# Patient Record
Sex: Female | Born: 1970
Health system: Southern US, Community
[De-identification: ages and names within clinical notes are randomized; demographics above are authoritative.]

## PROBLEM LIST (undated history)

## (undated) ENCOUNTER — Ambulatory Visit (HOSPITAL_COMMUNITY)
Admission: EM | Discharge status: Absent without leave | Payer: Federal, State, Local not specified - PPO | Source: Ambulatory Visit

## (undated) DIAGNOSIS — G43909 Migraine, unspecified, not intractable, without status migrainosus: Secondary | ICD-10-CM

## (undated) DIAGNOSIS — J329 Chronic sinusitis, unspecified: Secondary | ICD-10-CM

## (undated) DIAGNOSIS — I10 Essential (primary) hypertension: Secondary | ICD-10-CM

## (undated) DIAGNOSIS — K219 Gastro-esophageal reflux disease without esophagitis: Secondary | ICD-10-CM

## (undated) DIAGNOSIS — R7303 Prediabetes: Secondary | ICD-10-CM

## (undated) DIAGNOSIS — Z9889 Other specified postprocedural states: Secondary | ICD-10-CM

## (undated) DIAGNOSIS — J45909 Unspecified asthma, uncomplicated: Secondary | ICD-10-CM

## (undated) HISTORY — DX: Chronic sinusitis, unspecified: J32.9

## (undated) HISTORY — DX: Migraine, unspecified, not intractable, without status migrainosus: G43.909

## (undated) HISTORY — DX: Gastro-esophageal reflux disease without esophagitis: K21.9

## (undated) HISTORY — DX: Unspecified asthma, uncomplicated: J45.909

## (undated) HISTORY — PX: CHOLECYSTECTOMY: SHX55

---

## 1996-10-07 HISTORY — PX: CHOLECYSTECTOMY: SHX55

## 1999-06-21 ENCOUNTER — Other Ambulatory Visit: Admission: RE | Admit: 1999-06-21 | Discharge: 1999-06-21 | Payer: Self-pay | Admitting: Obstetrics & Gynecology

## 1999-12-11 ENCOUNTER — Inpatient Hospital Stay (HOSPITAL_COMMUNITY): Admission: AD | Admit: 1999-12-11 | Discharge: 1999-12-13 | Payer: Self-pay | Admitting: Obstetrics and Gynecology

## 2000-01-08 ENCOUNTER — Other Ambulatory Visit: Admission: RE | Admit: 2000-01-08 | Discharge: 2000-01-08 | Payer: Self-pay | Admitting: Obstetrics & Gynecology

## 2001-10-28 ENCOUNTER — Other Ambulatory Visit: Admission: RE | Admit: 2001-10-28 | Discharge: 2001-10-28 | Payer: Self-pay | Admitting: Obstetrics and Gynecology

## 2002-04-04 ENCOUNTER — Inpatient Hospital Stay (HOSPITAL_COMMUNITY): Admission: AD | Admit: 2002-04-04 | Discharge: 2002-04-07 | Payer: Self-pay | Admitting: Obstetrics and Gynecology

## 2002-05-12 ENCOUNTER — Other Ambulatory Visit: Admission: RE | Admit: 2002-05-12 | Discharge: 2002-05-12 | Payer: Self-pay | Admitting: Obstetrics & Gynecology

## 2003-11-10 ENCOUNTER — Ambulatory Visit (HOSPITAL_COMMUNITY): Admission: RE | Admit: 2003-11-10 | Discharge: 2003-11-10 | Payer: Self-pay | Admitting: Obstetrics and Gynecology

## 2003-12-05 ENCOUNTER — Other Ambulatory Visit: Admission: RE | Admit: 2003-12-05 | Discharge: 2003-12-05 | Payer: Self-pay | Admitting: Obstetrics and Gynecology

## 2004-08-11 ENCOUNTER — Inpatient Hospital Stay (HOSPITAL_COMMUNITY): Admission: AD | Admit: 2004-08-11 | Discharge: 2004-08-13 | Payer: Self-pay | Admitting: Obstetrics and Gynecology

## 2004-08-14 ENCOUNTER — Encounter: Admission: RE | Admit: 2004-08-14 | Discharge: 2004-09-13 | Payer: Self-pay | Admitting: Obstetrics and Gynecology

## 2004-10-14 ENCOUNTER — Encounter: Admission: RE | Admit: 2004-10-14 | Discharge: 2004-11-13 | Payer: Self-pay | Admitting: Obstetrics and Gynecology

## 2004-11-14 ENCOUNTER — Encounter: Admission: RE | Admit: 2004-11-14 | Discharge: 2004-12-14 | Payer: Self-pay | Admitting: Obstetrics and Gynecology

## 2005-05-16 IMAGING — US US PELVIS COMPLETE MODIFY
1 series · 14 of 23 positions shown · non-contrast
Comparison: none

CLINICAL DATA: Positive pregnancy test, bleeding.

[Series 1: unknown · 0.30mm/px · 14 of 23 slices shown]
[im 1/23]
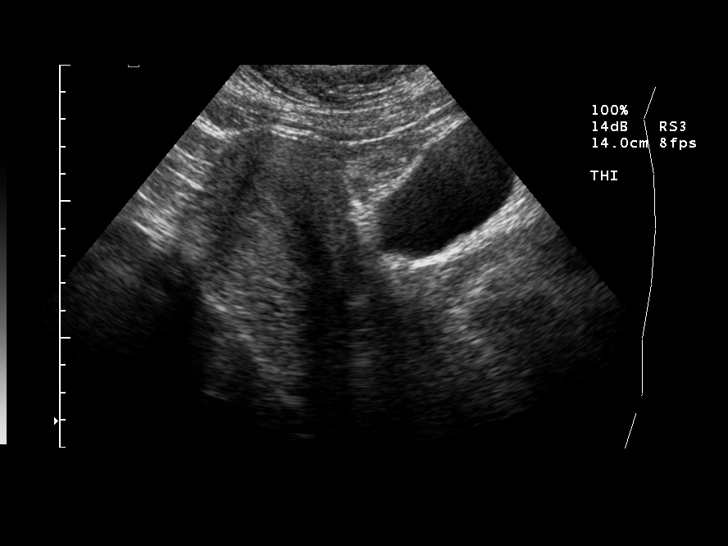
[im 3/23]
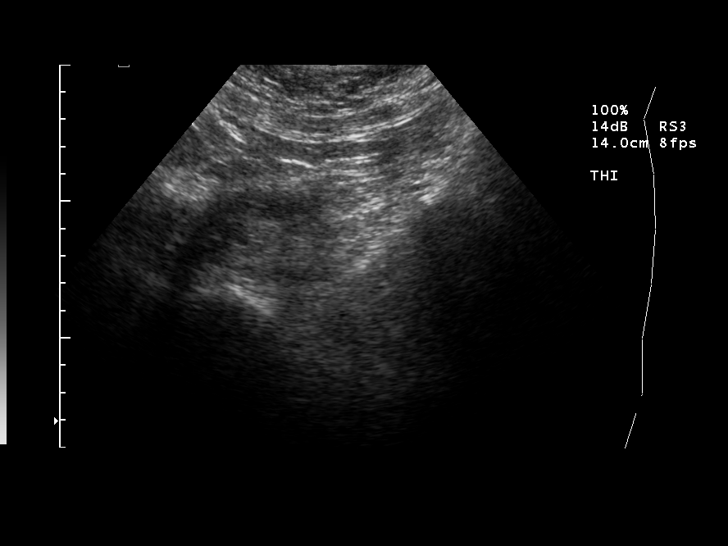
[im 5/23]
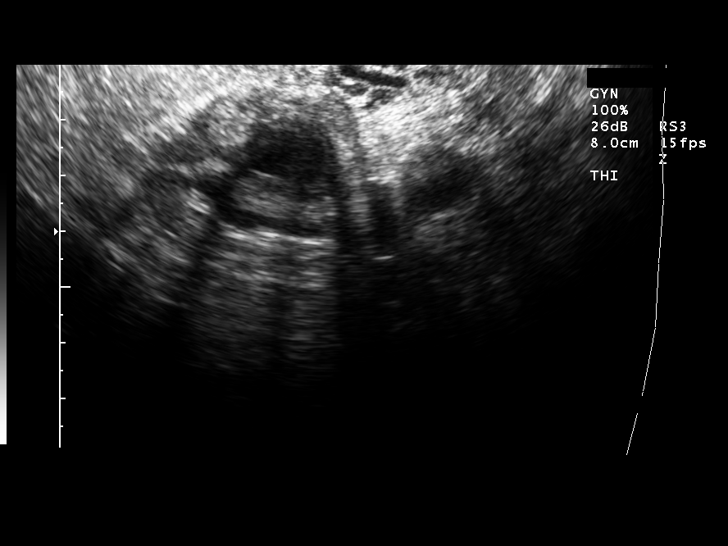
[im 6/23]
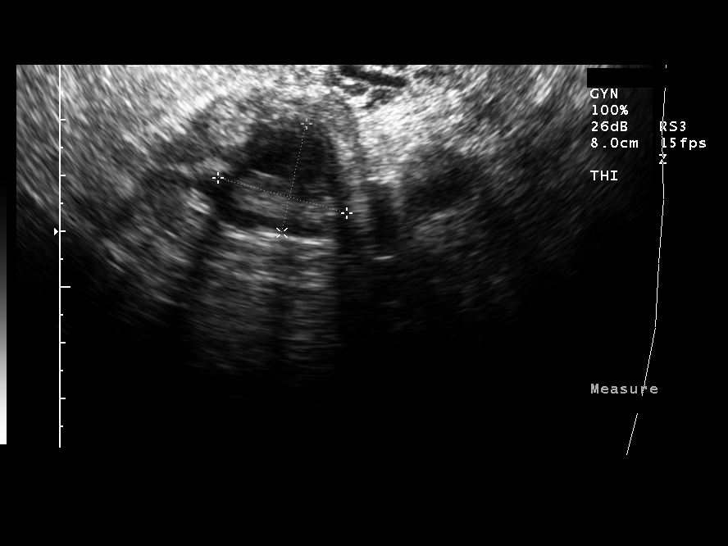
[im 8/23]
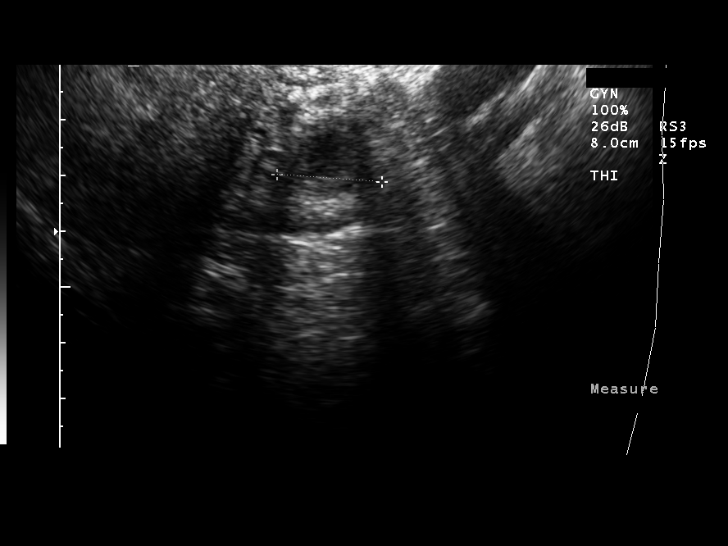
[im 10/23]
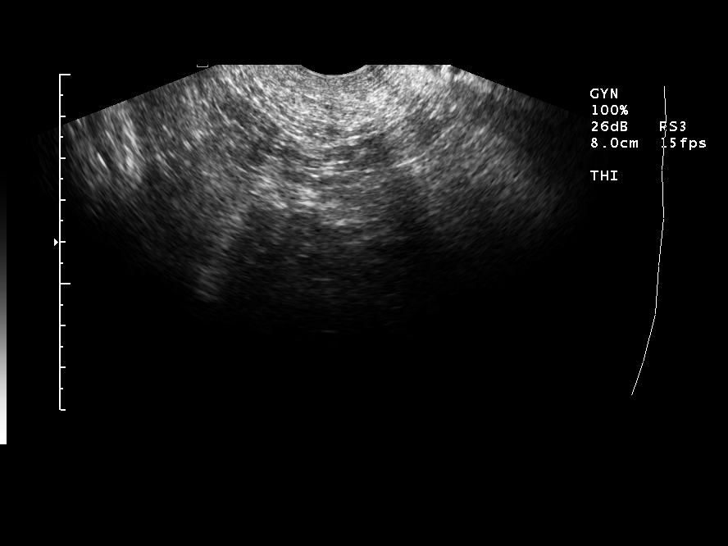
[im 11/23]
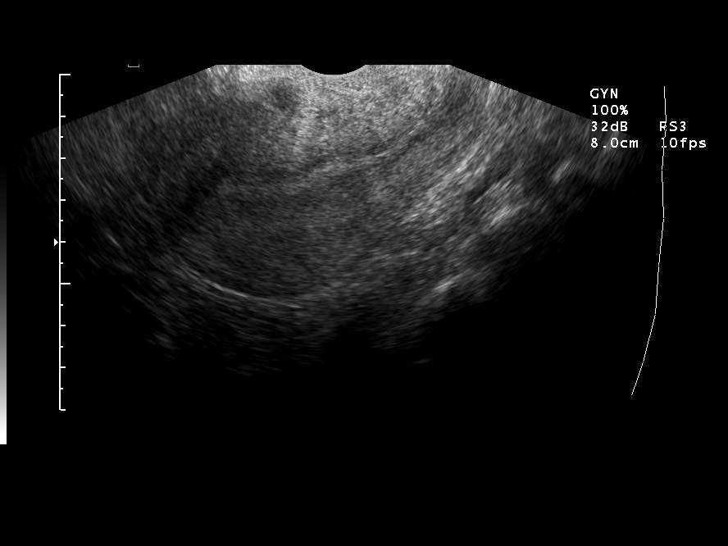
[im 13/23]
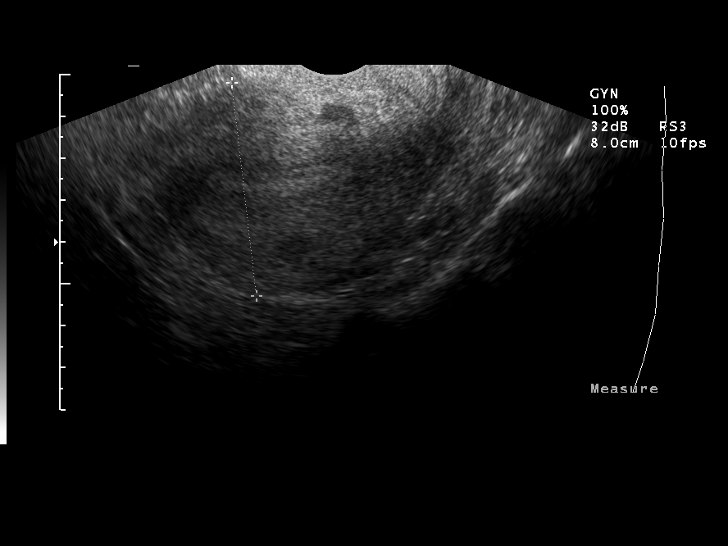
[im 14/23]
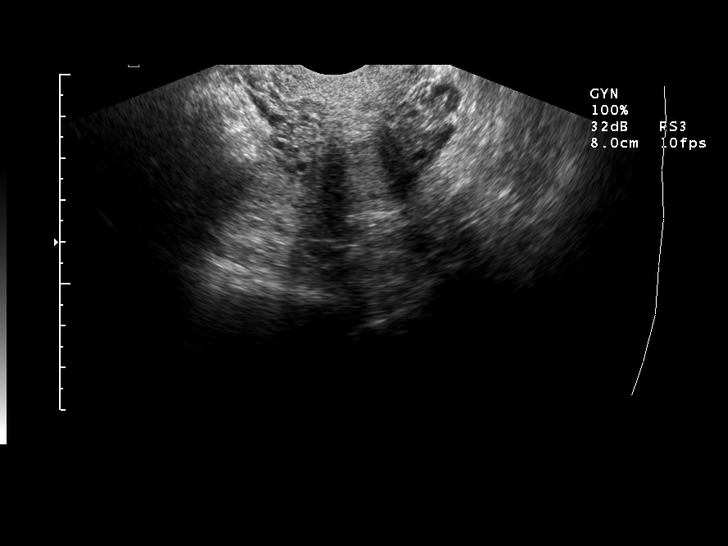
[im 16/23]
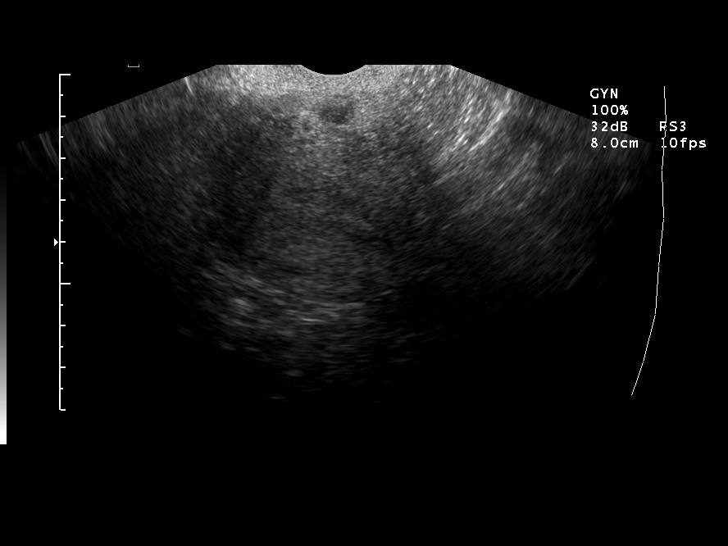
[im 18/23]
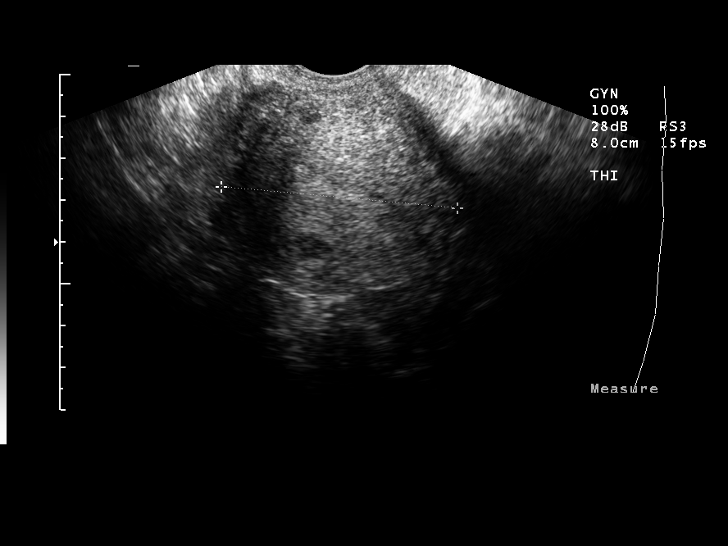
[im 19/23]
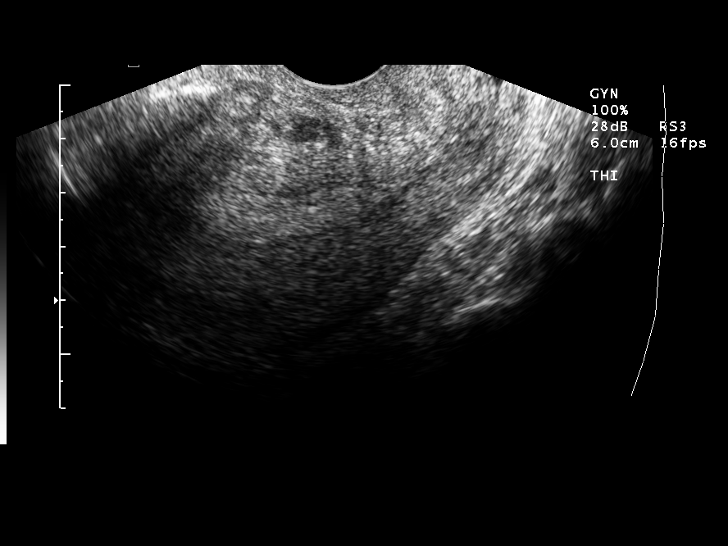
[im 21/23]
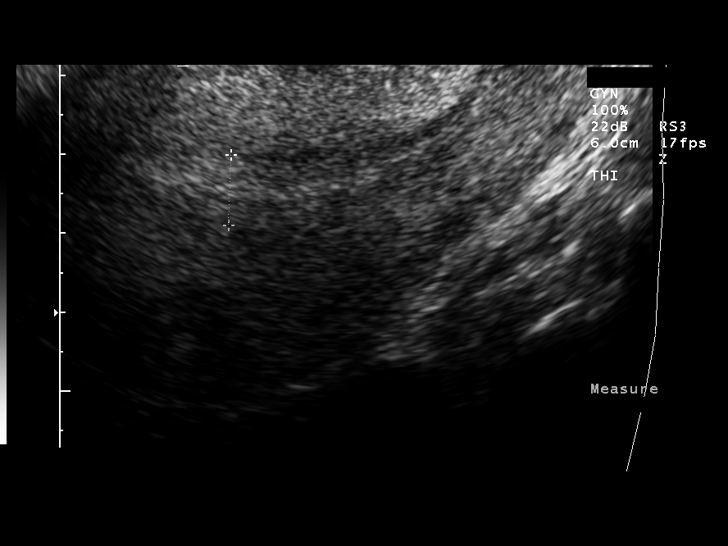
[im 23/23]
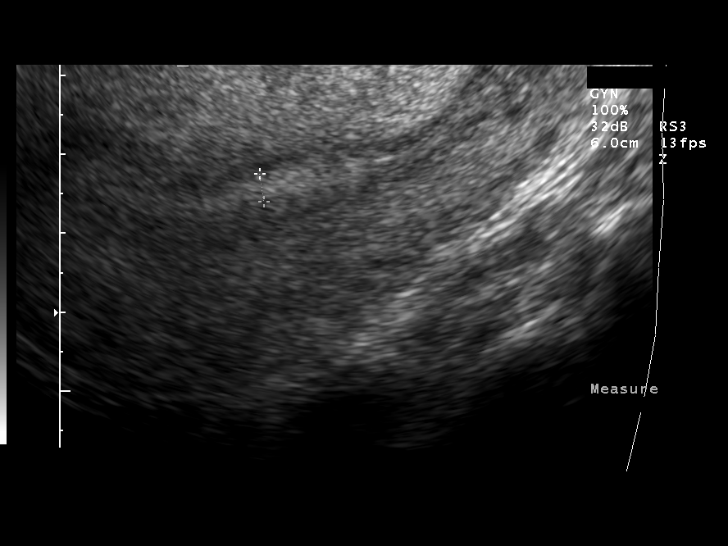

[14 of 23 positions shown; findings below may reference images not displayed]

TRANSABDOMINAL AND TRANSVAGINAL PELVIC ULTRASOUND
 Scanning was performed transabdominally through a minimally full urinary bladder, then transvaginally.  Uterine cervical complex appears normal in size, shape, and position with the uterus measuring 6.2 x 3.0 x 5.1 cm.  The endometrium measures 5 mm.  There is no intrauterine gestational sac.  There is no fluid in the endometrial canal.  The myometrial tissue appears normal.  The right ovary is not identified.  The left ovary measures 2.4 x 2.0 x 1.9 cm.  There is no free fluid.  No sign of an adnexal mass. 

 IMPRESSION
 No sign of intrauterine gestational sac or fluid in the endometrial canal. 

 No free fluid. 

 No adnexal lesion seen.  The right ovary is not identified on either form of the scan. 

 [REDACTED]

## 2010-10-09 ENCOUNTER — Encounter
Admission: RE | Admit: 2010-10-09 | Discharge: 2010-11-06 | Payer: Self-pay | Source: Home / Self Care | Attending: Family Medicine | Admitting: Family Medicine

## 2010-11-12 ENCOUNTER — Encounter: Payer: Federal, State, Local not specified - PPO | Attending: Family Medicine | Admitting: *Deleted

## 2010-11-12 DIAGNOSIS — Z713 Dietary counseling and surveillance: Secondary | ICD-10-CM | POA: Insufficient documentation

## 2010-11-12 DIAGNOSIS — E669 Obesity, unspecified: Secondary | ICD-10-CM | POA: Insufficient documentation

## 2010-12-10 ENCOUNTER — Encounter: Payer: Federal, State, Local not specified - PPO | Attending: Family Medicine | Admitting: *Deleted

## 2010-12-10 DIAGNOSIS — E669 Obesity, unspecified: Secondary | ICD-10-CM | POA: Insufficient documentation

## 2010-12-10 DIAGNOSIS — Z713 Dietary counseling and surveillance: Secondary | ICD-10-CM | POA: Insufficient documentation

## 2011-01-07 ENCOUNTER — Encounter: Payer: Federal, State, Local not specified - PPO | Admitting: *Deleted

## 2011-01-22 ENCOUNTER — Encounter: Payer: Federal, State, Local not specified - PPO | Attending: Family Medicine | Admitting: *Deleted

## 2011-01-22 DIAGNOSIS — Z713 Dietary counseling and surveillance: Secondary | ICD-10-CM | POA: Insufficient documentation

## 2011-01-22 DIAGNOSIS — E669 Obesity, unspecified: Secondary | ICD-10-CM | POA: Insufficient documentation

## 2011-02-22 NOTE — H&P (Signed)
NAME:  Marie Davis, Marie Davis                 ACCOUNT NO.:  [id number]   MEDICAL RECORD NO.:  [id number]          PATIENT TYPE:  MAT   LOCATION:  [facility]                          FACILITY:  [facility]   PHYSICIAN:  Lenoard Aden, M.D.DATE OF BIRTH:  1971-05-03   DATE OF ADMISSION:  DATE OF DISCHARGE:                                HISTORY & PHYSICAL   CHIEF COMPLAINT:  History of precipitous labor for induction.   HISTORY OF PRESENT ILLNESS:  The patient is a 40 year old white female G5,  P3, EDB August 16, 2004 at 39+ weeks for induction due to favorable cervix  and a history of precipitous labor.  She has allergies to Phenergan and  Reglan.   MEDICATIONS:  Prenatal vitamins.   History of three spontaneous vaginal deliveries, cholecystectomy in 1998.   FAMILY HISTORY:  Diabetes, thyroid dysfunction.   The patient with a history of migraine headaches.  Blood type is A+. GBS is  negative.  Rubella immune.  Hepatitis and HIV negative.   PHYSICAL EXAMINATION:  GENERAL:  A well-developed, well-nourished, white  female in no acute distress.  HEENT:  Normal.  LUNGS:  Clear.  HEART:  A regular rhythm.  ABDOMEN:  Soft, gravid, nontender.  PELVIC:  Cervix 2-3 cm, 50% vertex, -2.  EXTREMITIES:  Reveal no cords.  NEUROLOGICAL:  Exam is nonfocal.   IMPRESSION:  1.  A 39 week intra-uterine pregnancy.  2.  History of precipitous labor for augmentation and induction.   PLAN:  To proceed with Pitocin augmentation and induction.  The risks and  benefits were discussed.  The patient acknowledges and wishes to proceed.     RJT/MEDQ  D:  08/10/2004  T:  08/11/2004  Job:  [id number]   cc:   [facility]

## 2011-02-22 NOTE — Discharge Summary (Signed)
[facility]  Patient:    Marie Davis, Marie Davis                          MRN: [id number] Adm. Date:  [id number] Disc. Date: [id number] Attending:  Lenoard Aden CC:         Sung Amabile. Roslyn Smiling, M.D.                           Discharge Summary  DISCHARGE DIAGNOSES:          1. Intrauterine pregnancy at term,                                  delivered.                               2. Rhesus-positive.  OPERATIVE PROCEDURES:         Spontaneous vaginal delivery and Pitocin augmentation on December 11, 1999.  HISTORY OF PRESENT ILLNESS:   The patient is a 40 year old woman, G2, P70, [facility] December 25, 1999, admitted at [redacted] weeks gestational age with spontaneous rupture of membranes at 5 a.m. on December 11, 1999.  HOSPITAL COURSE:              The patient was admitted to [facility] labor and delivery.  Cervix at the time of the admission was 3 to 4 cm. Contractions were q.2-3 min.  Pitocin augmentation was necessary to improve labor pattern later in the morning.  Epidural anesthesia was given at the patients request during the active phase of labor.  Labor progression was excellent with the second state at 17 minutes.  She was delivered spontaneously of a live female, 3070 grams with Apgars of 9 and 9 over an intact perineum.  Estimated blood loss was 350 cc and there were no complications.  POSTPARTUM COURSE:            The patients postpartum course was unremarkable.  Her hemoglobin stabilized at 11.2.  She was discharged to home on the second postpartum in stable condition.  She will be followed up by the [facility] in four to six weeks.  Routine status post vaginal delivery instructions were given.  MEDICATIONS AT DISCHARGE:     Prenatal vitamins and Motrin 600 mg q.6h. p.r.n. D:  02/01/00 TD:  02/06/00 Job: [id number]

## 2011-04-12 ENCOUNTER — Encounter: Payer: Self-pay | Admitting: Family Medicine

## 2011-04-12 DIAGNOSIS — G43909 Migraine, unspecified, not intractable, without status migrainosus: Secondary | ICD-10-CM | POA: Insufficient documentation

## 2011-04-12 DIAGNOSIS — J329 Chronic sinusitis, unspecified: Secondary | ICD-10-CM | POA: Insufficient documentation

## 2013-04-26 ENCOUNTER — Ambulatory Visit (INDEPENDENT_AMBULATORY_CARE_PROVIDER_SITE_OTHER): Payer: Federal, State, Local not specified - PPO | Admitting: Family Medicine

## 2013-04-26 VITALS — BP 130/88 | HR 70 | Resp 16 | Wt 293.0 lb

## 2013-04-26 DIAGNOSIS — J069 Acute upper respiratory infection, unspecified: Secondary | ICD-10-CM

## 2013-04-26 DIAGNOSIS — J329 Chronic sinusitis, unspecified: Secondary | ICD-10-CM

## 2013-04-26 DIAGNOSIS — J019 Acute sinusitis, unspecified: Secondary | ICD-10-CM

## 2013-04-28 ENCOUNTER — Encounter: Payer: Self-pay | Admitting: Family Medicine

## 2013-04-28 DIAGNOSIS — J019 Acute sinusitis, unspecified: Secondary | ICD-10-CM | POA: Insufficient documentation

## 2013-04-28 DIAGNOSIS — J069 Acute upper respiratory infection, unspecified: Secondary | ICD-10-CM | POA: Insufficient documentation

## 2013-04-28 NOTE — Progress Notes (Signed)
  Subjective:    Patient ID: Marie Davis, female    DOB: [birth year], 42 y.o.   MRN: [id number]  HPI Patient presents with sinus pressure and drainage for the past 5 days. She has a history recurrent sinusitis typically gets once a year severely. She does note that she had a dry cough that started 2 days ago. She's been taking Mucinex and NyQuil with little relief. She'll be traveling next week and is concerned about worsening symptoms or if any fever or shortness of breath nausea vomiting.   Review of Systems - per above  GEN- denies fatigue, fever, weight loss,weakness, recent illness HEENT- denies eye drainage, change in vision, +nasal discharge, CVS- denies chest pain, palpitations RESP- denies SOB, cough, wheeze       Objective:   Physical Exam  GEN- NAD, alert and oriented x3 HEENT- PERRL, EOMI, non injected sclera, pink conjunctiva, MMM, oropharynx clear, TM clear bilat no effusion, + maxillary sinus tenderness,+ inflammed turbinates,  Nasal drainage  Neck- Supple, no LAD CVS- RRR, no murmur RESP-CTAB Pulses- Radial 2+         Assessment & Plan:

## 2013-04-28 NOTE — Assessment & Plan Note (Signed)
Supportive care. This is likely viral. She was given Marie Davis for the cough increase fluids per

## 2013-04-28 NOTE — Assessment & Plan Note (Signed)
Based on the time and this is likely viral and she has some cough associated. She does have history of recurrent sinusitis and since she is leaving town I will go ahead and give her prescription for Augmentin one tablet twice a day for 10 days. She will also be given Flonase for sinusitis and postnasal drip.

## 2013-10-06 ENCOUNTER — Telehealth: Payer: Self-pay | Admitting: *Deleted

## 2013-10-06 MED ORDER — FLUTICASONE PROPIONATE 50 MCG/ACT NA SUSP: 2.0000 | Freq: Every day | NASAL | Status: DC

## 2013-10-06 NOTE — Telephone Encounter (Signed)
Fluticasone prop spray, use 2 sprays each nostril daily as needed  Med refilled

## 2013-10-08 ENCOUNTER — Telehealth: Payer: Self-pay | Admitting: Family Medicine

## 2013-10-08 MED ORDER — FLUTICASONE PROPIONATE 50 MCG/ACT NA SUSP: 2.0000 | Freq: Every day | NASAL | Status: DC

## 2013-10-08 NOTE — Telephone Encounter (Signed)
Medication refilled per protocol. 

## 2016-10-03 DIAGNOSIS — Z1151 Encounter for screening for human papillomavirus (HPV): Secondary | ICD-10-CM | POA: Diagnosis not present

## 2016-10-03 DIAGNOSIS — N920 Excessive and frequent menstruation with regular cycle: Secondary | ICD-10-CM | POA: Diagnosis not present

## 2016-10-03 DIAGNOSIS — Z1231 Encounter for screening mammogram for malignant neoplasm of breast: Secondary | ICD-10-CM | POA: Diagnosis not present

## 2016-10-03 DIAGNOSIS — Z01419 Encounter for gynecological examination (general) (routine) without abnormal findings: Secondary | ICD-10-CM | POA: Diagnosis not present

## 2016-10-03 DIAGNOSIS — R87615 Unsatisfactory cytologic smear of cervix: Secondary | ICD-10-CM | POA: Diagnosis not present

## 2016-10-28 DIAGNOSIS — R87615 Unsatisfactory cytologic smear of cervix: Secondary | ICD-10-CM | POA: Diagnosis not present

## 2016-10-28 DIAGNOSIS — N83201 Unspecified ovarian cyst, right side: Secondary | ICD-10-CM | POA: Diagnosis not present

## 2016-10-28 DIAGNOSIS — N920 Excessive and frequent menstruation with regular cycle: Secondary | ICD-10-CM | POA: Diagnosis not present

## 2017-03-09 DIAGNOSIS — J019 Acute sinusitis, unspecified: Secondary | ICD-10-CM | POA: Diagnosis not present

## 2017-03-09 DIAGNOSIS — R030 Elevated blood-pressure reading, without diagnosis of hypertension: Secondary | ICD-10-CM | POA: Diagnosis not present

## 2017-03-10 ENCOUNTER — Ambulatory Visit (INDEPENDENT_AMBULATORY_CARE_PROVIDER_SITE_OTHER): Payer: Federal, State, Local not specified - PPO | Admitting: Physician Assistant

## 2017-03-10 ENCOUNTER — Encounter (INDEPENDENT_AMBULATORY_CARE_PROVIDER_SITE_OTHER): Payer: Self-pay | Admitting: Physician Assistant

## 2017-03-10 ENCOUNTER — Ambulatory Visit (INDEPENDENT_AMBULATORY_CARE_PROVIDER_SITE_OTHER): Payer: Self-pay

## 2017-03-10 VITALS — Ht 62.0 in | Wt 295.0 lb

## 2017-03-10 DIAGNOSIS — M79672 Pain in left foot: Secondary | ICD-10-CM | POA: Diagnosis not present

## 2017-03-10 NOTE — Addendum Note (Signed)
Addended by: Richardean CanalLARK, Jalisha Enneking on: 03/10/2017 04:08 PM   Modules accepted: Level of Service

## 2017-03-10 NOTE — Progress Notes (Signed)
Office Visit Note   Patient: Marie CornerKaren M Ditullio           Date of Birth: 02/20/1971           MRN: [id number] Visit Date: 03/10/2017              Requested by: Donita BrooksPickard, Warren T, MD [address] [place], [place] [id number] PCP: Donita BrooksPickard, Warren T, MD   Assessment & Plan: Visit Diagnoses:  1. Pain in left foot     Plan: Explained to her that she needed to be good about her shoe wear. See if there is a particular shoe that is rubbing the lateral aspect of her foot that may have caused her pain. Otherwise she should be measured for shoes and is to make sure she sizes not changed. She follow with us on an as-needed basis  Follow-Up Instructions: Return if symptoms worsen or fail to improve.   Orders:  Orders Placed This Encounter  Procedures  . XR Foot Complete Left   No orders of the defined types were placed in this encounter.     Procedures: No procedures performed   Clinical Data: No additional findings.   Subjective: Chief Complaint  Patient presents with  . Left Foot - Pain    HPI This is a 46 year old female were seen for the first time due to left foot pain for about a week and a half. She did notice some extreme pain in the foot after doing some walking . She had some swelling but no bruising. No known injury. Pain overall trending towards improvement. She does feel that after rest for approximately 4 days with upper respiratory infection that her pain has began to subside. She is really having no pain at this point in time with ambulation. She mostly wanted x-rays today to make sure she did not have some type of stress fracture.  Review of Systems  Positive for Recent upper respiratory infection. Otherwise negative please see history of present illness  Objective: Vital Signs: Ht 5\' 2"  (1.575 m)   Wt 295 lb (133.8 kg)   BMI 53.96 kg/m   Physical Exam  Constitutional: She is oriented to person, place, and time. She appears well-developed and  well-nourished. No distress.  Pulmonary/Chest: Effort normal.  Neurological: She is alert and oriented to person, place, and time.  Psychiatric: She has a normal mood and affect. Her behavior is normal.    Ortho Exam Left foot: Sensation intact throughout the foot to light touch. Dorsal pedal pulses present. No rashes skin lesions ulcerations, ecchymosis or edema compared to the right. 5 out of 5 strength with inversion eversion against resistance left foot. Tenderness over the lateral and dorsal aspect of the fifth metatarsal head only. Good range of motion through the fifth metatarsal phalangeal joint without pain. Remainder of the left foot is nontender. Specialty Comments:  No specialty comments available.  Imaging: Xr Foot Complete Left  Result Date: 03/10/2017 Left foot 3 views: No acute fracture. Bipartite medial sesamoid. Otherwise no bony abnormalities. No subluxation of the fifth MP joint.    PMFS History: Patient Active Problem List   Diagnosis Date Noted  . Acute sinusitis 04/28/2013  . URI, acute 04/28/2013  . Migraines   . Recurrent sinus infections    Past Medical History:  Diagnosis Date  . Acid reflux   . Asthma   . Migraines   . Recurrent sinus infections     Family History  Problem Relation Age  of Onset  . Lung disease Mother   . Heart disease Maternal Grandmother   . Diabetes Maternal Grandfather     Past Surgical History:  Procedure Laterality Date  . CHOLECYSTECTOMY     Social History   Occupational History  . Not on file.   Social History Main Topics  . Smoking status: Never Smoker  . Smokeless tobacco: Never Used  . Alcohol use No  . Drug use: No  . Sexual activity: Not on file

## 2017-03-14 DIAGNOSIS — N920 Excessive and frequent menstruation with regular cycle: Secondary | ICD-10-CM | POA: Diagnosis not present

## 2017-03-17 ENCOUNTER — Ambulatory Visit (INDEPENDENT_AMBULATORY_CARE_PROVIDER_SITE_OTHER): Payer: Federal, State, Local not specified - PPO | Admitting: Physician Assistant

## 2017-03-20 DIAGNOSIS — Z3202 Encounter for pregnancy test, result negative: Secondary | ICD-10-CM | POA: Diagnosis not present

## 2017-03-20 DIAGNOSIS — N920 Excessive and frequent menstruation with regular cycle: Secondary | ICD-10-CM | POA: Diagnosis not present

## 2017-03-20 DIAGNOSIS — R030 Elevated blood-pressure reading, without diagnosis of hypertension: Secondary | ICD-10-CM | POA: Diagnosis not present

## 2017-04-18 DIAGNOSIS — N920 Excessive and frequent menstruation with regular cycle: Secondary | ICD-10-CM | POA: Diagnosis not present

## 2018-08-06 ENCOUNTER — Encounter (HOSPITAL_COMMUNITY): Payer: Self-pay

## 2018-08-06 ENCOUNTER — Ambulatory Visit (HOSPITAL_COMMUNITY)
Admission: EM | Admit: 2018-08-06 | Discharge: 2018-08-06 | Disposition: A | Discharge status: Home / Self Care | Payer: Federal, State, Local not specified - PPO | Attending: Emergency Medicine | Admitting: Emergency Medicine

## 2018-08-06 ENCOUNTER — Other Ambulatory Visit: Payer: Self-pay

## 2018-08-06 DIAGNOSIS — J22 Unspecified acute lower respiratory infection: Secondary | ICD-10-CM

## 2018-08-06 DIAGNOSIS — J4521 Mild intermittent asthma with (acute) exacerbation: Secondary | ICD-10-CM

## 2018-08-06 MED ORDER — PREDNISONE 20 MG PO TABS: 40.0000 mg | ORAL_TABLET | Freq: Every day | ORAL | 0 refills | Status: AC

## 2018-08-06 MED ORDER — ALBUTEROL SULFATE 108 (90 BASE) MCG/ACT IN AEPB: 1.0000 | INHALATION_SPRAY | RESPIRATORY_TRACT | 0 refills | Status: AC | PRN

## 2018-08-06 MED ORDER — AZITHROMYCIN 250 MG PO TABS: ORAL_TABLET | ORAL | 0 refills | Status: AC

## 2018-08-06 NOTE — ED Provider Notes (Signed)
[facility]    CSN: [id number] Arrival date & time: 08/06/18  4098     History   Chief Complaint Chief Complaint  Patient presents with  . Cough    HPI Marie Davis is a 47 y.o. female.   Marie Davis presents with complaints of worsening cough. Developed URI symptoms a week ago with sore throat and congestion. These symptoms improved somewhat but led to increased cough. Cough has been worsening. Has episodes of unable to catch breath and wheezing. Chest tightness. Cough is productive. No known fevers. No gi/gu complaints. No further sore throat or ear pain. No rash. History of asthma, states her inhaler has ran out. Has been taking sudafed, mucinex, dayquil/nyquil which have not helped. 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 Doesn't smoke. Hx of asthma, reflux, migraines. Doesn't follow with a PCP.     ROS per HPI.      Past Medical History:  Diagnosis Date  . Acid reflux   . Asthma   . Migraines   . Recurrent sinus infections     Patient Active Problem List   Diagnosis Date Noted  . Acute sinusitis 04/28/2013  . URI, acute 04/28/2013  . Migraines   . Recurrent sinus infections     Past Surgical History:  Procedure Laterality Date  . CHOLECYSTECTOMY      OB History   None      Home Medications    Prior to Admission medications   Medication Sig Start Date End Date Taking? Authorizing Provider  Albuterol Sulfate 108 (90 Base) MCG/ACT AEPB Inhale 1-2 puffs into the lungs every 4 (four) hours as needed. 08/06/18   Georgetta Haber, NP  azithromycin (ZITHROMAX) 250 MG tablet Take 2 tablets (500 mg total) by mouth daily for 1 day, THEN 1 tablet (250 mg total) daily for 4 days. 08/06/18 08/11/18  Georgetta Haber, NP  predniSONE (DELTASONE) 20 MG tablet Take 2 tablets (40 mg total) by mouth daily with breakfast for 3 days. 08/06/18 08/09/18  Georgetta Haber, NP    Family History Family History  Problem Relation Age of Onset  . Lung disease Mother   .  Heart disease Maternal Grandmother   . Diabetes Maternal Grandfather     Social History Social History   Tobacco Use  . Smoking status: Never Smoker  . Smokeless tobacco: Never Used  Substance Use Topics  . Alcohol use: No  . Drug use: No     Allergies   Neosporin [neomycin-polymyxin-gramicidin]   Review of Systems Review of Systems   Physical Exam Triage Vital Signs ED Triage Vitals  Enc Vitals Group     BP 08/06/18 0834 (!) 173/112     Pulse Rate 08/06/18 0834 85     Resp 08/06/18 0834 16     Temp 08/06/18 0834 (!) 97.3 F (36.3 C)     Temp Source 08/06/18 0834 Oral     SpO2 08/06/18 0834 94 %     Weight 08/06/18 0837 289 lb (131.1 kg)     Height --      Head Circumference --      Peak Flow --      Pain Score --      Pain Loc --      Pain Edu? --      Excl. in GC? --    No data found.  Updated Vital Signs BP (!) 173/112 (BP Location: Left Wrist)   Pulse 85   Temp (!) 97.3 F (  36.3 C) (Oral)   Resp 16   Wt 289 lb (131.1 kg)   SpO2 94%   BMI 52.86 kg/m   Physical Exam  Constitutional: She is oriented to person, place, and time. She appears well-developed and well-nourished. No distress.  HENT:  Head: Normocephalic and atraumatic.  Right Ear: Tympanic membrane, external ear and ear canal normal.  Left Ear: Tympanic membrane, external ear and ear canal normal.  Nose: Nose normal.  Mouth/Throat: Uvula is midline, oropharynx is clear and moist and mucous membranes are normal. No tonsillar exudate.  Eyes: Pupils are equal, round, and reactive to light. Conjunctivae and EOM are normal.  Cardiovascular: Normal rate, regular rhythm and normal heart sounds.  Pulmonary/Chest: Effort normal. No accessory muscle usage. No tachypnea. No respiratory distress. She has decreased breath sounds in the right lower field and the left lower field.  Strong congested cough noted; illicited with deep breathing   Neurological: She is alert and oriented to person, place,  and time.  Skin: Skin is warm and dry.     UC Treatments / Results  Labs (all labs ordered are listed, but only abnormal results are displayed) Labs Reviewed - No data to display  EKG None  Radiology No results found.  Procedures Procedures (including critical care time)  Medications Ordered in UC Medications - No data to display  Initial Impression / Assessment and Plan / UC Course  I have reviewed the triage vital signs and the nursing notes.  Pertinent labs & imaging results that were available during my care of the patient were reviewed by me and considered in my medical decision making (see chart for details).     Worsening cough after 7 days of illness. Discussed viral vs bacterial etiology. Worsening cough in this asthmatic patient, opted to provided empiric coverage with azithromycin. Prednisone and inhaler also provided. Return precautions provided. If symptoms worsen or do not improve in the next week to return to be seen or to follow up with PCP.  Patient verbalized understanding and agreeable to plan.  Encouraged establish with PCP for BP recheck.  Final Clinical Impressions(s) / UC Diagnoses   Final diagnoses:  Lower respiratory tract infection  Mild intermittent asthma with exacerbation     Discharge Instructions     Push fluids to ensure adequate hydration and keep secretions thin.  ,Tylenol and/or ibuprofen as needed for pain or fevers.  3 days of prednisone.  Use of inhaler as needed.  Complete course of antibiotics.  If symptoms worsen or do not improve in the next week to return to be seen.  Please establish with a primary care provider for recheck as needed and for recheck of your Bp.      ED Prescriptions    Medication Sig Dispense Auth. Provider   Albuterol Sulfate 108 (90 Base) MCG/ACT AEPB Inhale 1-2 puffs into the lungs every 4 (four) hours as needed. 1 each Georgetta Haber, NP   azithromycin (ZITHROMAX) 250 MG tablet Take 2 tablets (500  mg total) by mouth daily for 1 day, THEN 1 tablet (250 mg total) daily for 4 days. 6 tablet Linus Mako B, NP   predniSONE (DELTASONE) 20 MG tablet Take 2 tablets (40 mg total) by mouth daily with breakfast for 3 days. 6 tablet Georgetta Haber, NP     Controlled Substance Prescriptions [place] Controlled Substance Registry consulted? Not Applicable   Georgetta Haber, NP 08/06/18 [phone]

## 2018-08-06 NOTE — ED Triage Notes (Signed)
CC:  cough congestion asthma x 1 week ago. Pt thinks it a cold that's moved down in her chest. ( chest congestion )

## 2018-08-06 NOTE — Discharge Instructions (Signed)
Push fluids to ensure adequate hydration and keep secretions thin.  ,Tylenol and/or ibuprofen as needed for pain or fevers.  3 days of prednisone.  Use of inhaler as needed.  Complete course of antibiotics.  If symptoms worsen or do not improve in the next week to return to be seen.  Please establish with a primary care provider for recheck as needed and for recheck of your Bp.

## 2018-10-07 HISTORY — PX: GASTRIC BYPASS: SHX52

## 2018-10-19 DIAGNOSIS — J320 Chronic maxillary sinusitis: Secondary | ICD-10-CM | POA: Diagnosis not present

## 2018-11-25 DIAGNOSIS — I10 Essential (primary) hypertension: Secondary | ICD-10-CM | POA: Diagnosis not present

## 2018-12-11 DIAGNOSIS — Z713 Dietary counseling and surveillance: Secondary | ICD-10-CM | POA: Diagnosis not present

## 2018-12-11 DIAGNOSIS — Z6841 Body Mass Index (BMI) 40.0 and over, adult: Secondary | ICD-10-CM | POA: Diagnosis not present

## 2018-12-18 DIAGNOSIS — Z0000 Encounter for general adult medical examination without abnormal findings: Secondary | ICD-10-CM | POA: Diagnosis not present

## 2018-12-18 DIAGNOSIS — I10 Essential (primary) hypertension: Secondary | ICD-10-CM | POA: Diagnosis not present

## 2018-12-18 DIAGNOSIS — Z13220 Encounter for screening for lipoid disorders: Secondary | ICD-10-CM | POA: Diagnosis not present

## 2018-12-18 DIAGNOSIS — Z6841 Body Mass Index (BMI) 40.0 and over, adult: Secondary | ICD-10-CM | POA: Diagnosis not present

## 2018-12-18 DIAGNOSIS — Z23 Encounter for immunization: Secondary | ICD-10-CM | POA: Diagnosis not present

## 2019-01-27 DIAGNOSIS — E785 Hyperlipidemia, unspecified: Secondary | ICD-10-CM | POA: Diagnosis not present

## 2019-01-27 DIAGNOSIS — Z6841 Body Mass Index (BMI) 40.0 and over, adult: Secondary | ICD-10-CM | POA: Diagnosis not present

## 2019-01-27 DIAGNOSIS — I10 Essential (primary) hypertension: Secondary | ICD-10-CM | POA: Diagnosis not present

## 2019-01-29 DIAGNOSIS — I10 Essential (primary) hypertension: Secondary | ICD-10-CM | POA: Diagnosis not present

## 2019-03-05 DIAGNOSIS — Z713 Dietary counseling and surveillance: Secondary | ICD-10-CM | POA: Diagnosis not present

## 2019-03-05 DIAGNOSIS — Z6841 Body Mass Index (BMI) 40.0 and over, adult: Secondary | ICD-10-CM | POA: Diagnosis not present

## 2019-03-17 DIAGNOSIS — R5383 Other fatigue: Secondary | ICD-10-CM | POA: Diagnosis not present

## 2019-03-17 DIAGNOSIS — Z136 Encounter for screening for cardiovascular disorders: Secondary | ICD-10-CM | POA: Diagnosis not present

## 2019-03-17 DIAGNOSIS — Z131 Encounter for screening for diabetes mellitus: Secondary | ICD-10-CM | POA: Diagnosis not present

## 2019-03-17 DIAGNOSIS — E559 Vitamin D deficiency, unspecified: Secondary | ICD-10-CM | POA: Diagnosis not present

## 2019-03-17 DIAGNOSIS — E785 Hyperlipidemia, unspecified: Secondary | ICD-10-CM | POA: Diagnosis not present

## 2019-03-17 DIAGNOSIS — I10 Essential (primary) hypertension: Secondary | ICD-10-CM | POA: Diagnosis not present

## 2019-03-17 DIAGNOSIS — Z6841 Body Mass Index (BMI) 40.0 and over, adult: Secondary | ICD-10-CM | POA: Diagnosis not present

## 2019-03-19 DIAGNOSIS — Z6841 Body Mass Index (BMI) 40.0 and over, adult: Secondary | ICD-10-CM | POA: Diagnosis not present

## 2019-03-19 DIAGNOSIS — I10 Essential (primary) hypertension: Secondary | ICD-10-CM | POA: Diagnosis not present

## 2019-03-19 DIAGNOSIS — R7303 Prediabetes: Secondary | ICD-10-CM | POA: Diagnosis not present

## 2019-03-21 DIAGNOSIS — Z6841 Body Mass Index (BMI) 40.0 and over, adult: Secondary | ICD-10-CM | POA: Diagnosis not present

## 2019-03-21 DIAGNOSIS — R7303 Prediabetes: Secondary | ICD-10-CM | POA: Diagnosis not present

## 2019-03-21 DIAGNOSIS — Z1159 Encounter for screening for other viral diseases: Secondary | ICD-10-CM | POA: Diagnosis not present

## 2019-03-21 DIAGNOSIS — Z01812 Encounter for preprocedural laboratory examination: Secondary | ICD-10-CM | POA: Diagnosis not present

## 2019-03-21 DIAGNOSIS — I10 Essential (primary) hypertension: Secondary | ICD-10-CM | POA: Diagnosis not present

## 2019-03-24 DIAGNOSIS — Z79899 Other long term (current) drug therapy: Secondary | ICD-10-CM | POA: Diagnosis not present

## 2019-03-24 DIAGNOSIS — K2950 Unspecified chronic gastritis without bleeding: Secondary | ICD-10-CM | POA: Diagnosis not present

## 2019-03-24 DIAGNOSIS — Z6841 Body Mass Index (BMI) 40.0 and over, adult: Secondary | ICD-10-CM | POA: Diagnosis not present

## 2019-03-24 DIAGNOSIS — K219 Gastro-esophageal reflux disease without esophagitis: Secondary | ICD-10-CM | POA: Diagnosis not present

## 2019-03-24 DIAGNOSIS — R7303 Prediabetes: Secondary | ICD-10-CM | POA: Diagnosis not present

## 2019-03-24 DIAGNOSIS — J45909 Unspecified asthma, uncomplicated: Secondary | ICD-10-CM | POA: Diagnosis not present

## 2019-03-24 DIAGNOSIS — I10 Essential (primary) hypertension: Secondary | ICD-10-CM | POA: Diagnosis not present

## 2019-03-24 DIAGNOSIS — Z881 Allergy status to other antibiotic agents status: Secondary | ICD-10-CM | POA: Diagnosis not present

## 2019-03-31 DIAGNOSIS — Z993 Dependence on wheelchair: Secondary | ICD-10-CM | POA: Diagnosis not present

## 2019-03-31 DIAGNOSIS — Z1159 Encounter for screening for other viral diseases: Secondary | ICD-10-CM | POA: Diagnosis not present

## 2019-04-13 DIAGNOSIS — I10 Essential (primary) hypertension: Secondary | ICD-10-CM | POA: Diagnosis not present

## 2019-04-13 DIAGNOSIS — E785 Hyperlipidemia, unspecified: Secondary | ICD-10-CM | POA: Diagnosis not present

## 2019-04-13 DIAGNOSIS — Z6841 Body Mass Index (BMI) 40.0 and over, adult: Secondary | ICD-10-CM | POA: Diagnosis not present

## 2019-04-13 DIAGNOSIS — Z01818 Encounter for other preprocedural examination: Secondary | ICD-10-CM | POA: Diagnosis not present

## 2019-04-15 DIAGNOSIS — Z1159 Encounter for screening for other viral diseases: Secondary | ICD-10-CM | POA: Diagnosis not present

## 2019-04-15 DIAGNOSIS — Z01812 Encounter for preprocedural laboratory examination: Secondary | ICD-10-CM | POA: Diagnosis not present

## 2019-04-19 DIAGNOSIS — I10 Essential (primary) hypertension: Secondary | ICD-10-CM | POA: Diagnosis not present

## 2019-04-19 DIAGNOSIS — Z6841 Body Mass Index (BMI) 40.0 and over, adult: Secondary | ICD-10-CM | POA: Diagnosis not present

## 2019-04-19 DIAGNOSIS — E785 Hyperlipidemia, unspecified: Secondary | ICD-10-CM | POA: Diagnosis not present

## 2019-04-19 DIAGNOSIS — Z79899 Other long term (current) drug therapy: Secondary | ICD-10-CM | POA: Diagnosis not present

## 2019-04-19 DIAGNOSIS — K219 Gastro-esophageal reflux disease without esophagitis: Secondary | ICD-10-CM | POA: Diagnosis not present

## 2019-04-28 DIAGNOSIS — B029 Zoster without complications: Secondary | ICD-10-CM | POA: Diagnosis not present

## 2019-05-10 DIAGNOSIS — Z6841 Body Mass Index (BMI) 40.0 and over, adult: Secondary | ICD-10-CM | POA: Diagnosis not present

## 2019-05-10 DIAGNOSIS — Z713 Dietary counseling and surveillance: Secondary | ICD-10-CM | POA: Diagnosis not present

## 2019-06-02 ENCOUNTER — Encounter: Payer: Self-pay | Admitting: Orthopaedic Surgery

## 2019-06-02 ENCOUNTER — Ambulatory Visit: Payer: Self-pay

## 2019-06-02 ENCOUNTER — Ambulatory Visit (INDEPENDENT_AMBULATORY_CARE_PROVIDER_SITE_OTHER): Payer: Federal, State, Local not specified - PPO | Admitting: Orthopaedic Surgery

## 2019-06-02 DIAGNOSIS — M1711 Unilateral primary osteoarthritis, right knee: Secondary | ICD-10-CM | POA: Diagnosis not present

## 2019-06-02 NOTE — Progress Notes (Signed)
Office Visit Note   Patient: Marie Davis           Date of Birth: 03/02/1971           MRN: [id number] Visit Date: 06/02/2019              Requested by: Donita BrooksPickard, Warren T, MD [address] [place],  [place] [id number] PCP: Donita BrooksPickard, Warren T, MD   Assessment & Plan: Visit Diagnoses:  1. Primary osteoarthritis of right knee     Plan: Due to the fact the patient had a recent gastric sleeve she is been told to avoid any steroids.  She is also unable to take oral anti-inflammatories due to this.  Therefore recommend that she check with her surgeon and see if she is able to use topical anti-inflammatories if so she can pick up diclofenac gel and applied ice to the knee 4 g 4 times daily.  She also is going to ask if she is able to take turmeric.  Discussed with her knee friendly exercises and quad strengthening.  She will follow-up with us on as-needed basis pain persist or becomes worse.  Questions were encouraged and answered at length today  Follow-Up Instructions: Return if symptoms worsen or fail to improve.   Orders:  Orders Placed This Encounter  Procedures  . XR Knee 1-2 Views Right   No orders of the defined types were placed in this encounter.     Procedures: No procedures performed   Clinical Data: No additional findings.   Subjective: Chief Complaint  Patient presents with  . Right Knee - Pain    HPI [name] is a 48 year old female who not seen since 2018.  She comes in today with complaint of right knee pain.  Pains been on and off for the past month.  She is exercising and doing a lot of side to side motion is doing aerobics.  She states the inside is on motion causes pain medial aspect right knee.  She had no particular injury to the knee.  She describes no mechanical symptoms in the knee.  She is recently undergone a gastric sleeve surgery and is still recovering from that she is lost 65 pounds since the surgery.  Review of Systems Negative for  fevers chills shortness breath chest pain  Objective: Vital Signs: There were no vitals taken for this visit.  Physical Exam Constitutional:      Appearance: She is not ill-appearing or diaphoretic.  Pulmonary:     Effort: Pulmonary effort is normal.  Neurological:     Mental Status: She is alert and oriented to person, place, and time.  Psychiatric:        Mood and Affect: Mood normal.     Ortho Exam Bilateral knees she has full range of motion without pain.  No instability valgus varus stressing of either knee.  Significant patellofemoral crepitus bilateral knees with passive range of motion.  Tenderness along medial joint line of the right knee only.  No effusion abnormal warmth of either knee.  McMurray's is negative bilaterally.  Calf supple nontender bilaterally Specialty Comments:  No specialty comments available.  Imaging: Xr Knee 1-2 Views Right  Result Date: 06/02/2019 Right knee AP lateral views: No acute fractures.  Moderate medial joint line narrowing.  Osteophytes off the lateral aspect of the lateral joint line.  Patellofemoral joint appears well maintained.  No bony abnormalities otherwise.    PMFS History: Patient Active Problem List  Diagnosis Date Noted  . Primary osteoarthritis of right knee 06/02/2019  . Acute sinusitis 04/28/2013  . URI, acute 04/28/2013  . Migraines   . Recurrent sinus infections    Past Medical History:  Diagnosis Date  . Acid reflux   . Asthma   . Migraines   . Recurrent sinus infections     Family History  Problem Relation Age of Onset  . Lung disease Mother   . Heart disease Maternal Grandmother   . Diabetes Maternal Grandfather     Past Surgical History:  Procedure Laterality Date  . CHOLECYSTECTOMY     Social History   Occupational History  . Not on file  Tobacco Use  . Smoking status: Never Smoker  . Smokeless tobacco: Never Used  Substance and Sexual Activity  . Alcohol use: No  . Drug use: No  . Sexual  activity: Not on file

## 2019-07-21 DIAGNOSIS — Z713 Dietary counseling and surveillance: Secondary | ICD-10-CM | POA: Diagnosis not present

## 2019-10-07 DIAGNOSIS — Z903 Acquired absence of stomach [part of]: Secondary | ICD-10-CM | POA: Diagnosis not present

## 2019-10-07 DIAGNOSIS — K912 Postsurgical malabsorption, not elsewhere classified: Secondary | ICD-10-CM | POA: Diagnosis not present

## 2023-04-03 ENCOUNTER — Encounter: Payer: Self-pay | Admitting: Physician Assistant

## 2023-04-03 ENCOUNTER — Ambulatory Visit: Payer: BC Managed Care – PPO | Admitting: Physician Assistant

## 2023-04-03 ENCOUNTER — Other Ambulatory Visit (INDEPENDENT_AMBULATORY_CARE_PROVIDER_SITE_OTHER): Payer: BC Managed Care – PPO

## 2023-04-03 VITALS — Ht 62.0 in | Wt 257.0 lb

## 2023-04-03 DIAGNOSIS — M25552 Pain in left hip: Secondary | ICD-10-CM

## 2023-04-03 MED ORDER — CELECOXIB 200 MG PO CAPS: 200.0000 mg | ORAL_CAPSULE | Freq: Every day | ORAL | 1 refills | Status: DC

## 2023-04-03 NOTE — Progress Notes (Signed)
Office Visit Note   Patient: Marie Davis           Date of Birth: 02-01-1971           MRN: [id number] Visit Date: 04/03/2023              Requested by: No referring provider defined for this encounter. PCP: Pcp, No   Assessment & Plan: Visit Diagnoses:  1. Pain of left hip     Plan: Plan will have her undergo an intra-articular injection of her left hip in the near future.  This both for diagnostic and therapeutic purposes.  She will continue her ibuprofen.  Will see her back after she returns from [place] to see what type of response she had to the injection.  Questions were encouraged and answered at length.  Follow-Up Instructions: Return for 2 weeks after hip injection.   Orders:  Orders Placed This Encounter  Procedures   XR HIP UNILAT W OR W/O PELVIS 1V LEFT   Meds ordered this encounter  Medications   celecoxib (CELEBREX) 200 MG capsule    Sig: Take 1 capsule (200 mg total) by mouth daily.    Dispense:  30 capsule    Refill:  1      Procedures: No procedures performed   Clinical Data: No additional findings.   Subjective: Chief Complaint  Patient presents with   Left Hip - Pain    HPI Marie Davis is a 52 year old female well-known to Dr. Raye Sorrow service comes in today with left hip pain for months.  Pain into the thigh goes down to the knee.  She notes groin pain.  Pain is worse with standing or walking for long periods of time.  She states ibuprofen helps minimally dulls the pain.  She reports that pains been in her hip since dancing at a wedding on May 20.  She is due to go to [place] on July 9.  She is is looking for some relief so that she can get through the trip in [place]. Review of Systems See HPI otherwise negative  Objective: Vital Signs: Ht 5\' 2"  (1.575 m)   Wt 257 lb (116.6 kg)   BMI 47.01 kg/m   Physical Exam General well-developed well-nourished female no acute distress mood and affect appropriate.  Psych alert and oriented x 3 Ortho  Exam Bilateral hips: Good range of motion of both hips.  She has pain with internal rotation of the left hip.  No tenderness over the trochanteric region bilaterally.  Ambulates without any assistive device. Specialty Comments:  No specialty comments available.  Imaging: XR HIP UNILAT W OR W/O PELVIS 1V LEFT  Result Date: 04/03/2023 AP pelvis lateral view left hip: Bilateral hips well located.  Left hip with slight narrowing particularly on the lateral view.  No other bony abnormalities.  No acute fractures.    PMFS History: Patient Active Problem List   Diagnosis Date Noted   Primary osteoarthritis of right knee 06/02/2019   Acute sinusitis 04/28/2013   URI, acute 04/28/2013   Migraines    Recurrent sinus infections    Past Medical History:  Diagnosis Date   Acid reflux    Asthma    Migraines    Recurrent sinus infections     Family History  Problem Relation Age of Onset   Lung disease Mother    Heart disease Maternal Grandmother    Diabetes Maternal Grandfather     Past Surgical History:  Procedure Laterality Date  CHOLECYSTECTOMY     Social History   Occupational History   Not on file  Tobacco Use   Smoking status: Never   Smokeless tobacco: Never  Substance and Sexual Activity   Alcohol use: No   Drug use: No   Sexual activity: Not on file

## 2023-04-04 ENCOUNTER — Other Ambulatory Visit: Payer: Self-pay

## 2023-04-04 ENCOUNTER — Ambulatory Visit (INDEPENDENT_AMBULATORY_CARE_PROVIDER_SITE_OTHER): Payer: BC Managed Care – PPO | Admitting: Sports Medicine

## 2023-04-04 DIAGNOSIS — M25552 Pain in left hip: Secondary | ICD-10-CM | POA: Diagnosis not present

## 2023-04-04 MED ORDER — LIDOCAINE HCL 1 % IJ SOLN
4.0000 mL | INTRAMUSCULAR | Status: AC | PRN
  Administered 2023-04-04: 4 mL

## 2023-04-04 MED ORDER — METHYLPREDNISOLONE ACETATE 80 MG/ML IJ SUSP
80.0000 mg | INTRAMUSCULAR | Status: AC | PRN
  Administered 2023-04-04: 80 mg via INTRA_ARTICULAR

## 2023-04-04 NOTE — Progress Notes (Signed)
   Procedure Note  Patient: Marie Davis             Date of Birth: 1971-03-04           MRN: [id number]             Visit Date: 04/04/2023  Procedures: Visit Diagnoses:  1. Pain of left hip    Large Joint Inj: L hip joint on 04/04/2023 10:46 AM Indications: pain Details: 22 G 3.5 in needle, ultrasound-guided anterior approach Medications: 4 mL lidocaine 1 %; 80 mg methylPREDNISolone acetate 80 MG/ML Outcome: tolerated well, no immediate complications  Procedure: US-guided intra-articular hip injection, left After discussion on risks/benefits/indications and informed verbal consent was obtained, a timeout was performed. Patient was lying supine on exam table. The hip was cleaned with betadine and alcohol swabs. Then utilizing ultrasound guidance, the patient's femoral head and neck junction was identified and subsequently injected with 4:1  lidocaine:depomedrol via an in-plane approach with ultrasound visualization of the injectate administered into the hip joint. Patient tolerated procedure well without immediate complications.  Procedure, treatment alternatives, risks and benefits explained, specific risks discussed. Consent was given by the patient. Immediately prior to procedure a time out was called to verify the correct patient, procedure, equipment, support staff and site/side marked as required. Patient was prepped and draped in the usual sterile fashion.    - I evaluated the patient about 5 minutes post-injection and she had improvement in pain and range of motion - follow-up with Rexene Edison as indicated; I am happy to see them as needed  Madelyn Brunner, DO Primary Care Sports Medicine Physician  [facility] - Orthopedics  This note was dictated using Dragon naturally speaking software and may contain errors in syntax, spelling, or content which have not been identified prior to signing this note.

## 2023-05-25 ENCOUNTER — Other Ambulatory Visit: Payer: Self-pay | Admitting: Physician Assistant

## 2023-06-02 ENCOUNTER — Ambulatory Visit (INDEPENDENT_AMBULATORY_CARE_PROVIDER_SITE_OTHER): Payer: BC Managed Care – PPO | Admitting: Physician Assistant

## 2023-06-02 ENCOUNTER — Ambulatory Visit: Payer: BC Managed Care – PPO | Admitting: Physician Assistant

## 2023-06-02 ENCOUNTER — Other Ambulatory Visit: Payer: Self-pay

## 2023-06-02 DIAGNOSIS — M25552 Pain in left hip: Secondary | ICD-10-CM

## 2023-06-02 MED ORDER — CELECOXIB 200 MG PO CAPS: 200.0000 mg | ORAL_CAPSULE | Freq: Every day | ORAL | 1 refills | Status: DC

## 2023-06-02 MED ORDER — DIAZEPAM 5 MG PO TABS: ORAL_TABLET | ORAL | 0 refills | Status: DC

## 2023-06-02 NOTE — Progress Notes (Signed)
HPI: [name] returns today for follow-up of her left hip.  She states she is not having groin pain but has anterior thigh pain.  She states whenever she moves her knee bed she has pain in the anterior thigh.  She feels the leg is weak.  She has difficulty donning shoes and socks due to the pain.  She also states that she has trouble walking due to the pain.  She does work as a Runner, broadcasting/film/video.  She feels that pain may be coming from her knee.  She has been taking Celebrex and Tylenol for pain and also has tried Voltaren gel. Notes that the injection by Dr. Shon Davis 04/04/2023 left hip intra-articular gave her relief from the hip pain particularly the groin pain and allowed her to get through her trip to [place].  Review of systems see HPI otherwise negative  Physical exam: General Well-developed well-nourished female no acute distress mood and affect appropriate.  Bilateral hips: Good range of motion both hips.  Left hip with internal/external rotation flexion and logrolling causes pain in the groin area and anterior thigh. Bilateral knees good range of motion without pain.  Impression: Left hip pain   Plan given the fact that she has continued pain in her left hip with range of motion and diminished range of motion with performing ADLs recommend MRI.  MRIs to evaluate for internal derangement left hip to evaluate cartilage as her radiographs showed only some slight narrowing.  I have her follow-up with Dr. Raye Davis after the MRI to go over results discuss further treatment.  She is claustrophobic and therefore Valium was sent in.  Also per her request did send in Celebrex.  She states she has had no adverse reactions to the Celebrex.  She does have a visit with her primary care physician next week and should get labs at that time.

## 2023-06-10 ENCOUNTER — Telehealth: Payer: Self-pay | Admitting: Physician Assistant

## 2023-06-10 NOTE — Telephone Encounter (Signed)
Called pt 1X and left vm for pt to call to set MRI review appt with PA Chestine Spore or Dr Magnus Ivan about 10 days after MRI on 9/25. Imagining is back up sending back results.

## 2023-06-11 ENCOUNTER — Telehealth: Payer: Self-pay | Admitting: Physician Assistant

## 2023-06-11 NOTE — Telephone Encounter (Signed)
Called pt 2X and left vm for pt to call and set MRI review with Magnus Ivan or [place] 10 days after 9/25

## 2023-07-02 ENCOUNTER — Ambulatory Visit
Admission: RE | Admit: 2023-07-02 | Discharge: 2023-07-02 | Disposition: A | Discharge status: Home / Self Care | Payer: BC Managed Care – PPO | Source: Ambulatory Visit | Attending: Physician Assistant | Admitting: Physician Assistant

## 2023-07-02 DIAGNOSIS — M25552 Pain in left hip: Secondary | ICD-10-CM

## 2023-07-14 ENCOUNTER — Ambulatory Visit (INDEPENDENT_AMBULATORY_CARE_PROVIDER_SITE_OTHER): Payer: BC Managed Care – PPO | Admitting: Orthopaedic Surgery

## 2023-07-14 ENCOUNTER — Encounter: Payer: Self-pay | Admitting: Orthopaedic Surgery

## 2023-07-14 VITALS — Ht 62.6 in | Wt 261.0 lb

## 2023-07-14 DIAGNOSIS — M25552 Pain in left hip: Secondary | ICD-10-CM

## 2023-07-14 MED ORDER — MELOXICAM 15 MG PO TABS: 15.0000 mg | ORAL_TABLET | Freq: Every day | ORAL | 2 refills | Status: DC

## 2023-07-14 NOTE — Progress Notes (Signed)
The patient is being seen in follow-up today after having a MRI of her left hip.  She is only 52 years old and was having such significant left hip pain that did warrant a MRI.  Her plain films which shown some joint space narrowing.  And thus a MRI was needed to really evaluate for any type of AVN or fracture given the amount of pain that she is having with that left hip.  She has had an intra-articular steroid injection in that left hip joint prior to her trip to [place] this summer.  Her hip had been hurting her for several months before then and ibuprofen has not really helped much in terms of giving her adequate relief.  She has been alternating Celebrex and Tylenol for her pain.  She has been having difficulty with her mobility as a relates to her left hip pain and at this point it is definitely affecting her mobility, her quality of life and her actives daily living.  She does continue to have left hip pain.  I have replaced her parents joints as well so there is certainly a genetic component of this arthritis.  MRIs reviewed with her.  It has not been read by the radiologist yet but she can see that there is subchondral edema in the acetabulum and the femoral head on the left side.  It looks normal on the right side.  There is also moderate joint effusion and significant cartilage wear.  There does not appear to be a component of AVN.  Her mother has had meloxicam in the past and that is helped.  She has been on Celebrex herself but we will switch her to meloxicam.  I have recommended a cane in her opposite hand and she can needs to continue to work on a weight loss journey.  Her BMI today is 46.83.  I would like to see her back in 6 weeks for repeat weight and BMI calculation.  I did have her lay in a supine position and I feel comfortable with the soft tissue plane for getting to her hip.  She is going to reach out to her primary care physician about weight loss options as well.

## 2023-08-05 ENCOUNTER — Encounter: Payer: Self-pay | Admitting: Orthopaedic Surgery

## 2023-08-21 NOTE — Telephone Encounter (Signed)
Spoke with patient and scheduled surgery. °

## 2023-08-27 ENCOUNTER — Ambulatory Visit: Payer: BC Managed Care – PPO | Admitting: Orthopaedic Surgery

## 2023-09-01 ENCOUNTER — Encounter: Payer: Self-pay | Admitting: Orthopaedic Surgery

## 2023-09-01 ENCOUNTER — Ambulatory Visit (INDEPENDENT_AMBULATORY_CARE_PROVIDER_SITE_OTHER): Payer: BC Managed Care – PPO | Admitting: Orthopaedic Surgery

## 2023-09-01 VITALS — Ht 62.6 in | Wt 250.0 lb

## 2023-09-01 DIAGNOSIS — M1612 Unilateral primary osteoarthritis, left hip: Secondary | ICD-10-CM | POA: Insufficient documentation

## 2023-09-01 DIAGNOSIS — M25552 Pain in left hip: Secondary | ICD-10-CM

## 2023-09-01 MED ORDER — CELECOXIB 200 MG PO CAPS: 200.0000 mg | ORAL_CAPSULE | Freq: Two times a day (BID) | ORAL | 1 refills | Status: DC | PRN

## 2023-09-01 NOTE — Progress Notes (Signed)
The patient is a 52 year old female who is here in follow-up as a relates to upcoming total hip arthroplasty surgery next month.  She has been on a weight loss journey.  When I first saw her her BMI was close to 47.  Her BMI today is 44.85 and she is continuing on her weight loss journey.  We have her scheduled for a left total hip arthroplasty on December 20.  I am comfortable with proceeding with surgery based on today but she is not a diabetic and is not immunocompromise.  Also she continues to lose weight and is very motivated.  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  On exam she still has significant pain with range of motion of her left hip.  The MRI of her left hip does show severe arthritis of the left hip.  When I have her lay in a supine position I am very comfortable from a surgical standpoint getting to her left hip.  We had a long thorough discussion again about the risks and benefits of the surgery.  She will stop weight loss medications 2 weeks before surgery and then can resume this after surgery.  We discussed what to expect from an intraoperative and postoperative standpoint.  All question concerns were answered addressed.  She is currently still denies any headache, chest pain, shortness of breath, fever, chills, nausea, vomiting.  Her physical exam showed no outstanding issues today in terms of her review of systems and exam.

## 2023-09-10 ENCOUNTER — Encounter (HOSPITAL_COMMUNITY): Payer: Self-pay

## 2023-09-10 NOTE — Progress Notes (Signed)
Sent message, via epic in basket, requesting orders in epic from surgeon.  

## 2023-09-10 NOTE — Patient Instructions (Addendum)
SURGICAL WAITING ROOM VISITATION  Patients having surgery or a procedure may have no more than 2 support people in the waiting area - these visitors may rotate.    Children under the age of 67 must have an adult with them who is not the patient.   If the patient needs to stay at the hospital during part of their recovery, the visitor guidelines for inpatient rooms apply. Pre-op nurse will coordinate an appropriate time for 1 support person to accompany patient in pre-op.  This support person may not rotate.    Please refer to the [facility] website for the visitor guidelines for Inpatients (after your surgery is over and you are in a regular room).       Your procedure is scheduled on: 09-26-23    Report to [facility] Main Entrance    Report to admitting at       0845  AM   Call this number if you have problems the morning of surgery [phone]   Do not eat food :After Midnight.   After Midnight you may have the following liquids until _0815_____ AM/ PM DAY OF SURGERY  Water Non-Citrus Juices (without pulp, NO RED-Firman, White grape, White cranberry) Black Coffee (NO MILK/CREAM OR CREAMERS, sugar ok)  Clear Tea (NO MILK/CREAM OR CREAMERS, sugar ok) regular and decaf                             Plain Jell-O (NO RED)                                           Fruit ices (not with fruit pulp, NO RED)                                     Popsicles (NO RED)                                                               Sports drinks like Gatorade (NO RED)                     The day of surgery:  COMPLETE  ONE (1)   G2 by  0815  AM the morning of surgery. Drink in one sitting. Do not sip.  This drink was given to you during your hospital  pre-op appointment visit. Nothing else to drink after completing the  G2    by 0815 am.          If you have questions, please contact your surgeon's office.   FOLLOW ANY ADDITIONAL PRE OP INSTRUCTIONS YOU RECEIVED FROM YOUR  SURGEON'S OFFICE!!!     Oral Hygiene is also important to reduce your risk of infection.                                    Remember - BRUSH YOUR TEETH THE MORNING OF SURGERY WITH YOUR REGULAR TOOTHPASTE  DENTURES WILL BE REMOVED PRIOR TO SURGERY  PLEASE DO NOT APPLY "Poly grip" OR ADHESIVES!!!   Do NOT smoke after Midnight   Stop all vitamins and herbal supplements 7 days before surgery.   Take these medicines the morning of surgery with A SIP OF WATER: Bring rescue inhaler with you               You may not have any metal on your body including hair pins, jewelry, and body piercing             Do not wear make-up, lotions, powders, perfumes/cologne, or deodorant  Do not wear nail polish including gel and S&S, artificial/acrylic nails, or any other type of covering on natural nails including finger and toenails. If you have artificial nails, gel coating, etc. that needs to be removed by a nail salon please have this removed prior to surgery or surgery may need to be canceled/ delayed if the surgeon/ anesthesia feels like they are unable to be safely monitored.   Do not shave  5 days prior to surgery.             Do not bring valuables to the hospital. [facility] IS NOT             RESPONSIBLE   FOR VALUABLES.   Contacts, glasses, dentures or bridgework may not be worn into surgery.   Bring small overnight bag day of surgery.   DO NOT BRING YOUR HOME MEDICATIONS TO THE HOSPITAL. PHARMACY WILL DISPENSE MEDICATIONS LISTED ON YOUR MEDICATION LIST TO YOU DURING YOUR ADMISSION IN THE HOSPITAL!    Patients discharged on the day of surgery will not be allowed to drive home.  Someone NEEDS to stay with you for the first 24 hours after anesthesia.   Special Instructions: Bring a copy of your healthcare power of attorney and living will documents the day of surgery if you haven't scanned them before.              Please read over the following fact sheets you were given: IF YOU HAVE  QUESTIONS ABOUT YOUR PRE-OP INSTRUCTIONS PLEASE CALL [phone]    If you test positive for Covid or have been in contact with anyone that has tested positive in the last 10 days please notify you surgeon.      Pre-operative 5 CHG Bath Instructions   You can play a key role in reducing the risk of infection after surgery. Your skin needs to be as free of germs as possible. You can reduce the number of germs on your skin by washing with CHG (chlorhexidine gluconate) soap before surgery. CHG is an antiseptic soap that kills germs and continues to kill germs even after washing.   DO NOT use if you have an allergy to chlorhexidine/CHG or antibacterial soaps. If your skin becomes reddened or irritated, stop using the CHG and notify one of our RNs at [phone].   Please shower with the CHG soap starting 4 days before surgery using the following schedule:     Please keep in mind the following:  DO NOT shave, including legs and underarms, starting the day of your first shower.   You may shave your face at any point before/day of surgery.  Place clean sheets on your bed the day you start using CHG soap. Use a clean washcloth (not used since being washed) for each shower. DO NOT sleep with pets once you start using the CHG.   CHG Shower Instructions:  If you choose to wash your  hair and private area, wash first with your normal shampoo/soap.  After you use shampoo/soap, rinse your hair and body thoroughly to remove shampoo/soap residue.  Turn the water OFF and apply about 3 tablespoons (45 ml) of CHG soap to a CLEAN washcloth.  Apply CHG soap ONLY FROM YOUR NECK DOWN TO YOUR TOES (washing for 3-5 minutes)  DO NOT use CHG soap on face, private areas, open wounds, or sores.  Pay special attention to the area where your surgery is being performed.  If you are having back surgery, having someone wash your back for you may be helpful. Wait 2 minutes after CHG soap is applied, then you may rinse  off the CHG soap.  Pat dry with a clean towel  Put on clean clothes/pajamas   If you choose to wear lotion, please use ONLY the CHG-compatible lotions on the back of this paper.     Additional instructions for the day of surgery: DO NOT APPLY any lotions, deodorants, cologne, or perfumes.   Put on clean/comfortable clothes.  Brush your teeth.  Ask your nurse before applying any prescription medications to the skin.      CHG Compatible Lotions   Aveeno Moisturizing lotion  Cetaphil Moisturizing Cream  Cetaphil Moisturizing Lotion  Clairol Herbal Essence Moisturizing Lotion, Dry Skin  Clairol Herbal Essence Moisturizing Lotion, Extra Dry Skin  Clairol Herbal Essence Moisturizing Lotion, Normal Skin  Curel Age Defying Therapeutic Moisturizing Lotion with Alpha Hydroxy  Curel Extreme Care Body Lotion  Curel Soothing Hands Moisturizing Hand Lotion  Curel Therapeutic Moisturizing Cream, Fragrance-Free  Curel Therapeutic Moisturizing Lotion, Fragrance-Free  Curel Therapeutic Moisturizing Lotion, Original Formula  Eucerin Daily Replenishing Lotion  Eucerin Dry Skin Therapy Plus Alpha Hydroxy Crme  Eucerin Dry Skin Therapy Plus Alpha Hydroxy Lotion  Eucerin Original Crme  Eucerin Original Lotion  Eucerin Plus Crme Eucerin Plus Lotion  Eucerin TriLipid Replenishing Lotion  Keri Anti-Bacterial Hand Lotion  Keri Deep Conditioning Original Lotion Dry Skin Formula Softly Scented  Keri Deep Conditioning Original Lotion, Fragrance Free Sensitive Skin Formula  Keri Lotion Fast Absorbing Fragrance Free Sensitive Skin Formula  Keri Lotion Fast Absorbing Softly Scented Dry Skin Formula  Keri Original Lotion  Keri Skin Renewal Lotion Keri Silky Smooth Lotion  Keri Silky Smooth Sensitive Skin Lotion  Nivea Body Creamy Conditioning Oil  Nivea Body Extra Enriched Lotion  Nivea Body Original Lotion  Nivea Body Sheer Moisturizing Lotion Nivea Crme  Nivea Skin Firming Lotion  NutraDerm 30  Skin Lotion  NutraDerm Skin Lotion  NutraDerm Therapeutic Skin Cream  NutraDerm Therapeutic Skin Lotion  ProShield Protective Hand Cream   Incentive Spirometer  An incentive spirometer is a tool that can help keep your lungs clear and active. This tool measures how well you are filling your lungs with each breath. Taking long deep breaths may help reverse or decrease the chance of developing breathing (pulmonary) problems (especially infection) following: A long period of time when you are unable to move or be active. BEFORE THE PROCEDURE  If the spirometer includes an indicator to show your best effort, your nurse or respiratory therapist will set it to a desired goal. If possible, sit up straight or lean slightly forward. Try not to slouch. Hold the incentive spirometer in an upright position. INSTRUCTIONS FOR USE  Sit on the edge of your bed if possible, or sit up as far as you can in bed or on a chair. Hold the incentive spirometer in an upright position. Breathe out normally.  Place the mouthpiece in your mouth and seal your lips tightly around it. Breathe in slowly and as deeply as possible, raising the piston or the ball toward the top of the column. Hold your breath for 3-5 seconds or for as long as possible. Allow the piston or ball to fall to the bottom of the column. Remove the mouthpiece from your mouth and breathe out normally. Rest for a few seconds and repeat Steps 1 through 7 at least 10 times every 1-2 hours when you are awake. Take your time and take a few normal breaths between deep breaths. The spirometer may include an indicator to show your best effort. Use the indicator as a goal to work toward during each repetition. After each set of 10 deep breaths, practice coughing to be sure your lungs are clear. If you have an incision (the cut made at the time of surgery), support your incision when coughing by placing a pillow or rolled up towels firmly against it. Once you  are able to get out of bed, walk around indoors and cough well. You may stop using the incentive spirometer when instructed by your caregiver.  RISKS AND COMPLICATIONS Take your time so you do not get dizzy or light-headed. If you are in pain, you may need to take or ask for pain medication before doing incentive spirometry. It is harder to take a deep breath if you are having pain. AFTER USE Rest and breathe slowly and easily. It can be helpful to keep track of a log of your progress. Your caregiver can provide you with a simple table to help with this. If you are using the spirometer at home, follow these instructions: SEEK MEDICAL CARE IF:  You are having difficultly using the spirometer. You have trouble using the spirometer as often as instructed. Your pain medication is not giving enough relief while using the spirometer. You develop fever of 100.5 F (38.1 C) or higher. SEEK IMMEDIATE MEDICAL CARE IF:  You cough up bloody sputum that had not been present before. You develop fever of 102 F (38.9 C) or greater. You develop worsening pain at or near the incision site. MAKE SURE YOU:  Understand these instructions. Will watch your condition. Will get help right away if you are not doing well or get worse. Document Released: 02/03/2007 Document Revised: 12/16/2011 Document Reviewed: 04/06/2007 Exit[employer] Patient Information 2014 [employer], [place].   ________________________________________________________________________ WHAT IS A BLOOD TRANSFUSION? Blood Transfusion Information  A transfusion is the replacement of blood or some of its parts. Blood is made up of multiple cells which provide different functions. Red blood cells carry oxygen and are used for blood loss replacement. White blood cells fight against infection. Platelets control bleeding. Plasma helps clot blood. Other blood products are available for specialized needs, such as hemophilia or other clotting  disorders. BEFORE THE TRANSFUSION  Who gives blood for transfusions?  Healthy volunteers who are fully evaluated to make sure their blood is safe. This is blood bank blood. Transfusion therapy is the safest it has ever been in the practice of medicine. Before blood is taken from a donor, a complete history is taken to make sure that person has no history of diseases nor engages in risky social behavior (examples are intravenous drug use or sexual activity with multiple partners). The donor's travel history is screened to minimize risk of transmitting infections, such as malaria. The donated blood is tested for signs of infectious diseases, such as HIV and hepatitis. The blood  is then tested to be sure it is compatible with you in order to minimize the chance of a transfusion reaction. If you or a relative donates blood, this is often done in anticipation of surgery and is not appropriate for emergency situations. It takes many days to process the donated blood. RISKS AND COMPLICATIONS Although transfusion therapy is very safe and saves many lives, the main dangers of transfusion include:  Getting an infectious disease. Developing a transfusion reaction. This is an allergic reaction to something in the blood you were given. Every precaution is taken to prevent this. The decision to have a blood transfusion has been considered carefully by your caregiver before blood is given. Blood is not given unless the benefits outweigh the risks. AFTER THE TRANSFUSION Right after receiving a blood transfusion, you will usually feel much better and more energetic. This is especially true if your red blood cells have gotten low (anemic). The transfusion raises the level of the red blood cells which carry oxygen, and this usually causes an energy increase. The nurse administering the transfusion will monitor you carefully for complications. HOME CARE INSTRUCTIONS  No special instructions are needed after a transfusion.  You may find your energy is better. Speak with your caregiver about any limitations on activity for underlying diseases you may have. SEEK MEDICAL CARE IF:  Your condition is not improving after your transfusion. You develop redness or irritation at the intravenous (IV) site. SEEK IMMEDIATE MEDICAL CARE IF:  Any of the following symptoms occur over the next 12 hours: Shaking chills. You have a temperature by mouth above 102 F (38.9 C), not controlled by medicine. Chest, back, or muscle pain. People around you feel you are not acting correctly or are confused. Shortness of breath or difficulty breathing. Dizziness and fainting. You get a rash or develop hives. You have a decrease in urine output. Your urine turns a dark color or changes to pink, red, or brown. Any of the following symptoms occur over the next 10 days: You have a temperature by mouth above 102 F (38.9 C), not controlled by medicine. Shortness of breath. Weakness after normal activity. The white part of the eye turns yellow (jaundice). You have a decrease in the amount of urine or are urinating less often. Your urine turns a dark color or changes to pink, red, or brown. Document Released: 09/20/2000 Document Revised: 12/16/2011 Document Reviewed: 05/09/2008 [facility] Patient Information 2014 [place], [place].  _______________________________________________________________________

## 2023-09-15 ENCOUNTER — Other Ambulatory Visit: Payer: Self-pay

## 2023-09-17 ENCOUNTER — Other Ambulatory Visit: Payer: Self-pay

## 2023-09-17 ENCOUNTER — Encounter (HOSPITAL_COMMUNITY): Payer: Self-pay

## 2023-09-17 ENCOUNTER — Encounter (HOSPITAL_COMMUNITY)
Admission: RE | Admit: 2023-09-17 | Discharge: 2023-09-17 | Disposition: A | Discharge status: Home / Self Care | Payer: BC Managed Care – PPO | Source: Ambulatory Visit | Attending: Orthopaedic Surgery | Admitting: Orthopaedic Surgery

## 2023-09-17 VITALS — BP 137/97 | HR 69 | Temp 97.9°F | Resp 18 | Ht 62.5 in | Wt 248.0 lb

## 2023-09-17 DIAGNOSIS — Z01818 Encounter for other preprocedural examination: Secondary | ICD-10-CM | POA: Insufficient documentation

## 2023-09-17 DIAGNOSIS — I10 Essential (primary) hypertension: Secondary | ICD-10-CM | POA: Insufficient documentation

## 2023-09-17 DIAGNOSIS — R7303 Prediabetes: Secondary | ICD-10-CM

## 2023-09-17 HISTORY — DX: Essential (primary) hypertension: I10

## 2023-09-17 HISTORY — DX: Prediabetes: R73.03

## 2023-09-17 HISTORY — DX: Other specified postprocedural states: Z98.890

## 2023-09-17 HISTORY — DX: Gastro-esophageal reflux disease without esophagitis: K21.9

## 2023-09-17 LAB — HEMOGLOBIN A1C
Hgb A1c MFr Bld: 5.7 % — ABNORMAL HIGH (ref 4.8–5.6)
Mean Plasma Glucose: 116.89 mg/dL

## 2023-09-17 LAB — BASIC METABOLIC PANEL
Anion gap: 5 (ref 5–15)
BUN: 23 mg/dL — ABNORMAL HIGH (ref 6–20)
CO2: 30 mmol/L (ref 22–32)
Calcium: 8.7 mg/dL — ABNORMAL LOW (ref 8.9–10.3)
Chloride: 101 mmol/L (ref 98–111)
Creatinine, Ser: 0.61 mg/dL (ref 0.44–1.00)
GFR, Estimated: >60 mL/min (ref >60)
Glucose, Bld: 93 mg/dL (ref 70–99)
Potassium: 4.1 mmol/L (ref 3.5–5.1)
Sodium: 136 mmol/L (ref 135–145)

## 2023-09-17 LAB — CBC
HCT: 40.2 % (ref 36.0–46.0)
Hemoglobin: 13.5 g/dL (ref 12.0–15.0)
MCH: 28 pg (ref 26.0–34.0)
MCHC: 33.6 g/dL (ref 30.0–36.0)
MCV: 83.2 fL (ref 80.0–100.0)
Platelets: 281 10*3/uL (ref 150–400)
RBC: 4.83 MIL/uL (ref 3.87–5.11)
RDW: 13.1 % (ref 11.5–15.5)
WBC: 7.1 10*3/uL (ref 4.0–10.5)
nRBC: 0 % (ref 0.0–0.2)

## 2023-09-17 LAB — SURGICAL PCR SCREEN
MRSA, PCR: NEGATIVE
Staphylococcus aureus: NEGATIVE

## 2023-09-17 NOTE — Progress Notes (Signed)
Anesthesia note:   PCP - Chyrl Civatte PA Cardiologist -none Other-   Chest x-ray -  EKG - 09/17/23 Stress Test - no ECHO - no Cardiac Cath - no CABG-no Pacemaker/ICD device last checked:no  Sleep Study - no CPAP - no  Pt is pre diabetic-yes CBG at PAT visit- Fasting Blood Sugar at home- Checks Blood Sugar _____  Blood Thinner:no Blood Thinner Instructions: Aspirin Instructions: Last Dose:  Anesthesia review:/No  reason:  Patient denies shortness of breath, fever, cough and chest pain at PAT appointment   Patient verbalized understanding of instructions that were given to them at the PAT appointment. Patient was also instructed that they will need to review over the PAT instructions again at home before surgery.

## 2023-09-25 NOTE — H&P (Signed)
TOTAL HIP ADMISSION H&P  Patient is admitted for left total hip arthroplasty.  Subjective:  Chief Complaint: left hip pain  HPI: Marie Davis, 52 y.o. female, has a history of pain and functional disability in the left hip(s) due to arthritis and patient has failed non-surgical conservative treatments for greater than 12 weeks to include NSAID's and/or analgesics, corticosteriod injections, viscosupplementation injections, flexibility and strengthening excercises, weight reduction as appropriate, and activity modification.  Onset of symptoms was gradual starting 1 years ago with gradually worsening course since that time.The patient noted no past surgery on the left hip(s).  Patient currently rates pain in the left hip at 10 out of 10 with activity. Patient has night pain, worsening of pain with activity and weight bearing, trendelenberg gait, pain that interfers with activities of daily living, and pain with passive range of motion. Patient has evidence of subchondral cysts, subchondral sclerosis, joint space narrowing, and subchondral edema  by imaging studies. This condition presents safety issues increasing the risk of falls.  There is no current active infection.  Patient Active Problem List   Diagnosis Date Noted   Unilateral primary osteoarthritis, left hip 09/01/2023   Primary osteoarthritis of right knee 06/02/2019   Acute sinusitis 04/28/2013   URI, acute 04/28/2013   Migraines    Recurrent sinus infections    Past Medical History:  Diagnosis Date   Asthma    GERD (gastroesophageal reflux disease)    rare   Hypertension    Migraines    PONV (postoperative nausea and vomiting)    Pre-diabetes    Recurrent sinus infections     Past Surgical History:  Procedure Laterality Date   CHOLECYSTECTOMY  1998   GASTRIC BYPASS  2020    No current facility-administered medications for this encounter.   Current Outpatient Medications  Medication Sig Dispense Refill Last Dose/Taking    acetaminophen (TYLENOL) 500 MG tablet Take 1,000 mg by mouth every 6 (six) hours as needed for moderate pain (pain score 4-6).   Taking As Needed   acetaminophen (TYLENOL) 650 MG CR tablet Take 1,300 mg by mouth every 8 (eight) hours as needed for pain.   Taking As Needed   Albuterol Sulfate 108 (90 Base) MCG/ACT AEPB Inhale 1-2 puffs into the lungs every 4 (four) hours as needed. 1 each 0 Taking As Needed   celecoxib (CELEBREX) 200 MG capsule Take 1 capsule (200 mg total) by mouth 2 (two) times daily between meals as needed. (Patient taking differently: Take 200 mg by mouth 2 (two) times daily.) 60 capsule 1 Taking Differently   cetirizine (ZYRTEC) 10 MG tablet Take 10 mg by mouth daily.   Taking   Cholecalciferol (VITAMIN D3 ULTRA STRENGTH) 125 MCG (5000 UT) capsule Take 5,000 Units by mouth daily.   Taking   lisinopril-hydrochlorothiazide (ZESTORETIC) 10-12.5 MG tablet Take 1 tablet by mouth daily.   Taking   Pediatric Multivit-Minerals (FLINTSTONES GUMMIES COMPLETE PO) Take 1 tablet by mouth at bedtime.   Taking   phentermine (ADIPEX-P) 37.5 MG tablet Take 37.5 mg by mouth daily.   Taking   Allergies  Allergen Reactions   Neosporin [Neomycin-Polymyxin-Gramicidin]     Break outs   Tape     Band-aids cause a rash    Social History   Tobacco Use   Smoking status: Never   Smokeless tobacco: Never  Substance Use Topics   Alcohol use: No    Family History  Problem Relation Age of Onset   Lung disease Mother  Heart disease Maternal Grandmother    Diabetes Maternal Grandfather      Review of Systems  Objective:  Physical Exam Vitals reviewed.  Constitutional:      Appearance: Normal appearance. She is obese.  HENT:     Head: Normocephalic and atraumatic.  Eyes:     Extraocular Movements: Extraocular movements intact.     Pupils: Pupils are equal, round, and reactive to light.  Cardiovascular:     Rate and Rhythm: Normal rate and regular rhythm.     Pulses: Normal  pulses.  Pulmonary:     Effort: Pulmonary effort is normal.     Breath sounds: Normal breath sounds.  Abdominal:     Palpations: Abdomen is soft.  Musculoskeletal:     Cervical back: Normal range of motion and neck supple.     Left hip: Tenderness and bony tenderness present. Decreased range of motion. Decreased strength.  Neurological:     Mental Status: She is alert and oriented to person, place, and time.  Psychiatric:        Behavior: Behavior normal.     Vital signs in last 24 hours:    Labs:   Estimated body mass index is 44.64 kg/m as calculated from the following:   Height as of 09/17/23: 5' 2.5" (1.588 m).   Weight as of 09/17/23: 112.5 kg.   Imaging Review MRI demonstrates severe degenerative joint disease of the left hip(s). The bone quality appears to be excellent for age and reported activity level.      Assessment/Plan:  End stage arthritis, left hip(s)  The patient history, physical examination, clinical judgement of the provider and imaging studies are consistent with end stage degenerative joint disease of the left hip(s) and total hip arthroplasty is deemed medically necessary. The treatment options including medical management, injection therapy, arthroscopy and arthroplasty were discussed at length. The risks and benefits of total hip arthroplasty were presented and reviewed. The risks due to aseptic loosening, infection, stiffness, dislocation/subluxation,  thromboembolic complications and other imponderables were discussed.  The patient acknowledged the explanation, agreed to proceed with the plan and consent was signed. Patient is being admitted for inpatient treatment for surgery, pain control, PT, OT, prophylactic antibiotics, VTE prophylaxis, progressive ambulation and ADL's and discharge planning.The patient is planning to be discharged home with home health services

## 2023-09-26 ENCOUNTER — Encounter (HOSPITAL_COMMUNITY)
Admission: RE | Disposition: A | Discharge status: Home / Self Care | Payer: Self-pay | Source: Ambulatory Visit | Attending: Orthopaedic Surgery

## 2023-09-26 ENCOUNTER — Other Ambulatory Visit: Payer: Self-pay

## 2023-09-26 ENCOUNTER — Ambulatory Visit (HOSPITAL_COMMUNITY): Payer: BC Managed Care – PPO

## 2023-09-26 ENCOUNTER — Observation Stay (HOSPITAL_COMMUNITY)
Admission: RE | Admit: 2023-09-26 | Discharge: 2023-09-27 | Disposition: A | Discharge status: Home / Self Care | Payer: BC Managed Care – PPO | Source: Ambulatory Visit | Attending: Orthopaedic Surgery | Admitting: Orthopaedic Surgery

## 2023-09-26 ENCOUNTER — Ambulatory Visit (HOSPITAL_COMMUNITY): Payer: BC Managed Care – PPO | Admitting: Certified Registered Nurse Anesthetist

## 2023-09-26 ENCOUNTER — Encounter (HOSPITAL_COMMUNITY): Payer: Self-pay | Admitting: Orthopaedic Surgery

## 2023-09-26 ENCOUNTER — Observation Stay (HOSPITAL_COMMUNITY): Payer: BC Managed Care – PPO

## 2023-09-26 DIAGNOSIS — J45909 Unspecified asthma, uncomplicated: Secondary | ICD-10-CM | POA: Insufficient documentation

## 2023-09-26 DIAGNOSIS — I10 Essential (primary) hypertension: Secondary | ICD-10-CM | POA: Diagnosis not present

## 2023-09-26 DIAGNOSIS — Z79899 Other long term (current) drug therapy: Secondary | ICD-10-CM | POA: Insufficient documentation

## 2023-09-26 DIAGNOSIS — Z96642 Presence of left artificial hip joint: Secondary | ICD-10-CM

## 2023-09-26 DIAGNOSIS — M1612 Unilateral primary osteoarthritis, left hip: Principal | ICD-10-CM | POA: Diagnosis present

## 2023-09-26 HISTORY — PX: TOTAL HIP ARTHROPLASTY: SHX124

## 2023-09-26 LAB — CBC
HCT: 39.2 % (ref 36.0–46.0)
Hemoglobin: 12.8 g/dL (ref 12.0–15.0)
MCH: 28.1 pg (ref 26.0–34.0)
MCHC: 32.7 g/dL (ref 30.0–36.0)
MCV: 86.2 fL (ref 80.0–100.0)
Platelets: 251 10*3/uL (ref 150–400)
RBC: 4.55 MIL/uL (ref 3.87–5.11)
RDW: 13.3 % (ref 11.5–15.5)
WBC: 14.1 10*3/uL — ABNORMAL HIGH (ref 4.0–10.5)
nRBC: 0 % (ref 0.0–0.2)

## 2023-09-26 LAB — BASIC METABOLIC PANEL
Anion gap: 9 (ref 5–15)
BUN: 16 mg/dL (ref 6–20)
CO2: 26 mmol/L (ref 22–32)
Calcium: 8.6 mg/dL — ABNORMAL LOW (ref 8.9–10.3)
Chloride: 104 mmol/L (ref 98–111)
Creatinine, Ser: 0.55 mg/dL (ref 0.44–1.00)
GFR, Estimated: >60 mL/min (ref >60)
Glucose, Bld: 116 mg/dL — ABNORMAL HIGH (ref 70–99)
Potassium: 4 mmol/L (ref 3.5–5.1)
Sodium: 139 mmol/L (ref 135–145)

## 2023-09-26 LAB — TYPE AND SCREEN
ABO/RH(D): A POS
Antibody Screen: NEGATIVE

## 2023-09-26 LAB — POCT PREGNANCY, URINE: Preg Test, Ur: NEGATIVE

## 2023-09-26 LAB — ABO/RH: ABO/RH(D): A POS

## 2023-09-26 SURGERY — ARTHROPLASTY, HIP, TOTAL, ANTERIOR APPROACH
Anesthesia: Monitor Anesthesia Care | Site: Hip | Laterality: Left

## 2023-09-26 MED ORDER — METHOCARBAMOL 500 MG PO TABS
500.0000 mg | ORAL_TABLET | Freq: Four times a day (QID) | ORAL | Status: DC | PRN
  Administered 2023-09-26 – 2023-09-27 (×2): 500 mg via ORAL
  Filled 2023-09-26 (×2): qty 1

## 2023-09-26 MED ORDER — FENTANYL CITRATE PF 50 MCG/ML IJ SOSY
25.0000 ug | PREFILLED_SYRINGE | INTRAMUSCULAR | Status: DC | PRN
  Administered 2023-09-26: 50 ug via INTRAVENOUS

## 2023-09-26 MED ORDER — VITAMIN D3 25 MCG (1000 UNIT) PO TABS
2000.0000 [IU] | ORAL_TABLET | Freq: Every day | ORAL | Status: DC
  Administered 2023-09-27: 2000 [IU] via ORAL
  Filled 2023-09-26 (×2): qty 2

## 2023-09-26 MED ORDER — DIPHENHYDRAMINE HCL 12.5 MG/5ML PO ELIX: 12.5000 mg | ORAL_SOLUTION | ORAL | Status: DC | PRN

## 2023-09-26 MED ORDER — MIDAZOLAM HCL 2 MG/2ML IJ SOLN
INTRAMUSCULAR | Status: AC
  Filled 2023-09-26: qty 2

## 2023-09-26 MED ORDER — ONDANSETRON HCL 4 MG/2ML IJ SOLN
INTRAMUSCULAR | Status: DC | PRN
  Administered 2023-09-26: 4 mg via INTRAVENOUS

## 2023-09-26 MED ORDER — PROPOFOL 500 MG/50ML IV EMUL
INTRAVENOUS | Status: AC
  Filled 2023-09-26: qty 50

## 2023-09-26 MED ORDER — MENTHOL 3 MG MT LOZG: 1.0000 | LOZENGE | OROMUCOSAL | Status: DC | PRN

## 2023-09-26 MED ORDER — POVIDONE-IODINE 10 % EX SWAB: 2.0000 | Freq: Once | CUTANEOUS | Status: DC

## 2023-09-26 MED ORDER — METOCLOPRAMIDE HCL 5 MG/ML IJ SOLN: 5.0000 mg | Freq: Three times a day (TID) | INTRAMUSCULAR | Status: DC | PRN

## 2023-09-26 MED ORDER — SODIUM CHLORIDE 0.9 % IR SOLN
Status: DC | PRN
  Administered 2023-09-26: 1000 mL

## 2023-09-26 MED ORDER — ALUM & MAG HYDROXIDE-SIMETH 200-200-20 MG/5ML PO SUSP: 30.0000 mL | ORAL | Status: DC | PRN

## 2023-09-26 MED ORDER — BUPIVACAINE IN DEXTROSE 0.75-8.25 % IT SOLN
INTRATHECAL | Status: DC | PRN
  Administered 2023-09-26: 1.6 mL via INTRATHECAL

## 2023-09-26 MED ORDER — DOCUSATE SODIUM 100 MG PO CAPS
100.0000 mg | ORAL_CAPSULE | Freq: Two times a day (BID) | ORAL | Status: DC
  Administered 2023-09-26 – 2023-09-27 (×2): 100 mg via ORAL
  Filled 2023-09-26 (×2): qty 1

## 2023-09-26 MED ORDER — OXYCODONE HCL 5 MG PO TABS
10.0000 mg | ORAL_TABLET | ORAL | Status: DC | PRN
  Administered 2023-09-26 (×2): 10 mg via ORAL
  Administered 2023-09-27 (×3): 15 mg via ORAL
  Filled 2023-09-26: qty 3
  Filled 2023-09-26: qty 2
  Filled 2023-09-26 (×2): qty 3

## 2023-09-26 MED ORDER — EPHEDRINE 5 MG/ML INJ
INTRAVENOUS | Status: AC
Start: 2023-09-26 — End: ?
  Filled 2023-09-26: qty 5

## 2023-09-26 MED ORDER — ONDANSETRON HCL 4 MG/2ML IJ SOLN
INTRAMUSCULAR | Status: AC
  Filled 2023-09-26: qty 2

## 2023-09-26 MED ORDER — DEXAMETHASONE SODIUM PHOSPHATE 10 MG/ML IJ SOLN
INTRAMUSCULAR | Status: AC
  Filled 2023-09-26: qty 1

## 2023-09-26 MED ORDER — CEFAZOLIN SODIUM-DEXTROSE 2-4 GM/100ML-% IV SOLN
2.0000 g | Freq: Four times a day (QID) | INTRAVENOUS | Status: AC
  Administered 2023-09-26 – 2023-09-27 (×2): 2 g via INTRAVENOUS
  Filled 2023-09-26 (×2): qty 100

## 2023-09-26 MED ORDER — PROPOFOL 1000 MG/100ML IV EMUL
INTRAVENOUS | Status: AC
  Filled 2023-09-26: qty 100

## 2023-09-26 MED ORDER — PANTOPRAZOLE SODIUM 40 MG PO TBEC
40.0000 mg | DELAYED_RELEASE_TABLET | Freq: Every day | ORAL | Status: DC
Start: 2023-09-26 — End: 2023-09-27
  Administered 2023-09-26 – 2023-09-27 (×2): 40 mg via ORAL
  Filled 2023-09-26 (×2): qty 1

## 2023-09-26 MED ORDER — HYDROMORPHONE HCL 1 MG/ML IJ SOLN: 0.5000 mg | INTRAMUSCULAR | Status: DC | PRN

## 2023-09-26 MED ORDER — STERILE WATER FOR IRRIGATION IR SOLN
Status: DC | PRN
  Administered 2023-09-26: 1000 mL

## 2023-09-26 MED ORDER — ALBUTEROL SULFATE (2.5 MG/3ML) 0.083% IN NEBU: 3.0000 mL | INHALATION_SOLUTION | RESPIRATORY_TRACT | Status: DC | PRN

## 2023-09-26 MED ORDER — MIDAZOLAM HCL 5 MG/5ML IJ SOLN
INTRAMUSCULAR | Status: DC | PRN
  Administered 2023-09-26: 2 mg via INTRAVENOUS

## 2023-09-26 MED ORDER — METOCLOPRAMIDE HCL 5 MG PO TABS: 5.0000 mg | ORAL_TABLET | Freq: Three times a day (TID) | ORAL | Status: DC | PRN

## 2023-09-26 MED ORDER — ASPIRIN 81 MG PO CHEW
81.0000 mg | CHEWABLE_TABLET | Freq: Two times a day (BID) | ORAL | Status: DC
Start: 2023-09-26 — End: 2023-09-27
  Administered 2023-09-26 – 2023-09-27 (×2): 81 mg via ORAL
  Filled 2023-09-26 (×2): qty 1

## 2023-09-26 MED ORDER — ONDANSETRON HCL 4 MG PO TABS: 4.0000 mg | ORAL_TABLET | Freq: Four times a day (QID) | ORAL | Status: DC | PRN

## 2023-09-26 MED ORDER — 0.9 % SODIUM CHLORIDE (POUR BTL) OPTIME
TOPICAL | Status: DC | PRN
  Administered 2023-09-26: 1000 mL

## 2023-09-26 MED ORDER — PROPOFOL 10 MG/ML IV BOLUS
INTRAVENOUS | Status: DC | PRN
  Administered 2023-09-26: 30 mg via INTRAVENOUS

## 2023-09-26 MED ORDER — PHENYLEPHRINE 80 MCG/ML (10ML) SYRINGE FOR IV PUSH (FOR BLOOD PRESSURE SUPPORT)
PREFILLED_SYRINGE | INTRAVENOUS | Status: DC | PRN
  Administered 2023-09-26: 30 ug via INTRAVENOUS

## 2023-09-26 MED ORDER — LACTATED RINGERS IV SOLN: INTRAVENOUS | Status: DC

## 2023-09-26 MED ORDER — PHENYLEPHRINE HCL-NACL 20-0.9 MG/250ML-% IV SOLN
INTRAVENOUS | Status: DC | PRN
  Administered 2023-09-26: 30 ug/min via INTRAVENOUS

## 2023-09-26 MED ORDER — OXYCODONE HCL 5 MG PO TABS
5.0000 mg | ORAL_TABLET | ORAL | Status: DC | PRN
  Administered 2023-09-26: 5 mg via ORAL
  Administered 2023-09-27: 10 mg via ORAL
  Filled 2023-09-26 (×2): qty 2
  Filled 2023-09-26: qty 1

## 2023-09-26 MED ORDER — METHOCARBAMOL 1000 MG/10ML IJ SOLN: 500.0000 mg | Freq: Four times a day (QID) | INTRAMUSCULAR | Status: DC | PRN

## 2023-09-26 MED ORDER — ACETAMINOPHEN 500 MG PO TABS
ORAL_TABLET | ORAL | Status: AC
  Filled 2023-09-26: qty 2

## 2023-09-26 MED ORDER — FENTANYL CITRATE PF 50 MCG/ML IJ SOSY
PREFILLED_SYRINGE | INTRAMUSCULAR | Status: AC
  Administered 2023-09-26: 50 ug via INTRAVENOUS
  Filled 2023-09-26: qty 2

## 2023-09-26 MED ORDER — EPHEDRINE SULFATE-NACL 50-0.9 MG/10ML-% IV SOSY
PREFILLED_SYRINGE | INTRAVENOUS | Status: DC | PRN
  Administered 2023-09-26: 10 mg via INTRAVENOUS

## 2023-09-26 MED ORDER — ACETAMINOPHEN 325 MG PO TABS: 325.0000 mg | ORAL_TABLET | Freq: Four times a day (QID) | ORAL | Status: DC | PRN

## 2023-09-26 MED ORDER — PROPOFOL 500 MG/50ML IV EMUL
INTRAVENOUS | Status: DC | PRN
  Administered 2023-09-26: 75 ug/kg/min via INTRAVENOUS

## 2023-09-26 MED ORDER — DEXAMETHASONE SODIUM PHOSPHATE 10 MG/ML IJ SOLN
INTRAMUSCULAR | Status: DC | PRN
  Administered 2023-09-26: 8 mg via INTRAVENOUS

## 2023-09-26 MED ORDER — ONDANSETRON HCL 4 MG/2ML IJ SOLN: 4.0000 mg | Freq: Four times a day (QID) | INTRAMUSCULAR | Status: DC | PRN

## 2023-09-26 MED ORDER — ORAL CARE MOUTH RINSE: 15.0000 mL | Freq: Once | OROMUCOSAL | Status: AC

## 2023-09-26 MED ORDER — CHLORHEXIDINE GLUCONATE 0.12 % MT SOLN
15.0000 mL | Freq: Once | OROMUCOSAL | Status: AC
  Administered 2023-09-26: 15 mL via OROMUCOSAL

## 2023-09-26 MED ORDER — CEFAZOLIN SODIUM-DEXTROSE 2-4 GM/100ML-% IV SOLN
2.0000 g | INTRAVENOUS | Status: AC
Start: 2023-09-26 — End: 2023-09-26
  Administered 2023-09-26: 2 g via INTRAVENOUS
  Filled 2023-09-26: qty 100

## 2023-09-26 MED ORDER — ACETAMINOPHEN 10 MG/ML IV SOLN
1000.0000 mg | Freq: Once | INTRAVENOUS | Status: DC | PRN
  Administered 2023-09-26: 1000 mg via INTRAVENOUS

## 2023-09-26 MED ORDER — PHENOL 1.4 % MT LIQD: 1.0000 | OROMUCOSAL | Status: DC | PRN

## 2023-09-26 MED ORDER — SODIUM CHLORIDE 0.9 % IV SOLN
INTRAVENOUS | Status: DC
Start: 2023-09-26 — End: 2023-09-27

## 2023-09-26 MED ORDER — TRANEXAMIC ACID-NACL 1000-0.7 MG/100ML-% IV SOLN
1000.0000 mg | INTRAVENOUS | Status: AC
  Administered 2023-09-26: 1000 mg via INTRAVENOUS
  Filled 2023-09-26: qty 100

## 2023-09-26 MED ORDER — SCOPOLAMINE 1 MG/3DAYS TD PT72: 1.0000 | MEDICATED_PATCH | TRANSDERMAL | Status: DC

## 2023-09-26 MED ORDER — SCOPOLAMINE 1 MG/3DAYS TD PT72
MEDICATED_PATCH | TRANSDERMAL | Status: AC
  Administered 2023-09-26: 1.5 mg via TRANSDERMAL
  Filled 2023-09-26: qty 1

## 2023-09-26 MED ORDER — FENTANYL CITRATE PF 50 MCG/ML IJ SOSY
PREFILLED_SYRINGE | INTRAMUSCULAR | Status: AC
  Administered 2023-09-26: 25 ug via INTRAVENOUS
  Filled 2023-09-26: qty 1

## 2023-09-26 MED ORDER — ACETAMINOPHEN 10 MG/ML IV SOLN
INTRAVENOUS | Status: AC
  Filled 2023-09-26: qty 100

## 2023-09-26 SURGICAL SUPPLY — 37 items
BAG COUNTER SPONGE SURGICOUNT (BAG) ×1 IMPLANT
BAG ZIPLOCK 12X15 (MISCELLANEOUS) IMPLANT
BENZOIN TINCTURE PRP APPL 2/3 (GAUZE/BANDAGES/DRESSINGS) IMPLANT
BLADE SAW SGTL 18X1.27X75 (BLADE) ×1 IMPLANT
COVER PERINEAL POST (MISCELLANEOUS) ×1 IMPLANT
COVER SURGICAL LIGHT HANDLE (MISCELLANEOUS) ×1 IMPLANT
CUP SECTOR GRIPTON 50MM (Cup) IMPLANT
DRAPE FOOT SWITCH (DRAPES) ×1 IMPLANT
DRAPE STERI IOBAN 125X83 (DRAPES) ×1 IMPLANT
DRAPE U-SHAPE 47X51 STRL (DRAPES) ×2 IMPLANT
DRSG AQUACEL AG ADV 3.5X10 (GAUZE/BANDAGES/DRESSINGS) ×1 IMPLANT
DURAPREP 26ML APPLICATOR (WOUND CARE) ×1 IMPLANT
ELECT REM PT RETURN 15FT ADLT (MISCELLANEOUS) ×1 IMPLANT
GAUZE XEROFORM 1X8 LF (GAUZE/BANDAGES/DRESSINGS) IMPLANT
GLOVE BIO SURGEON STRL SZ7.5 (GLOVE) ×1 IMPLANT
GLOVE BIOGEL PI IND STRL 8 (GLOVE) ×2 IMPLANT
GLOVE ECLIPSE 8.0 STRL XLNG CF (GLOVE) ×1 IMPLANT
GOWN STRL REUS W/ TWL XL LVL3 (GOWN DISPOSABLE) ×2 IMPLANT
HEAD FEMORAL 32 CERAMIC (Hips) IMPLANT
HOLDER FOLEY CATH W/STRAP (MISCELLANEOUS) ×1 IMPLANT
KIT TURNOVER KIT A (KITS) IMPLANT
LINER ACET PNNCL PLUS4 NEUTRAL (Hips) IMPLANT
PACK ANTERIOR HIP CUSTOM (KITS) ×1 IMPLANT
PINNACLE PLUS 4 NEUTRAL (Hips) ×1 IMPLANT
SET HNDPC FAN SPRY TIP SCT (DISPOSABLE) ×1 IMPLANT
STAPLER SKIN PROX WIDE 3.9 (STAPLE) IMPLANT
STEM FEM ACTIS HIGH SZ1 (Stem) IMPLANT
STRIP CLOSURE SKIN 1/2X4 (GAUZE/BANDAGES/DRESSINGS) IMPLANT
SUT ETHIBOND NAB CT1 #1 30IN (SUTURE) ×1 IMPLANT
SUT ETHILON 2 0 PS N (SUTURE) IMPLANT
SUT MNCRL AB 4-0 PS2 18 (SUTURE) IMPLANT
SUT VIC AB 0 CT1 36 (SUTURE) ×1 IMPLANT
SUT VIC AB 1 CT1 36 (SUTURE) ×1 IMPLANT
SUT VIC AB 2-0 CT1 TAPERPNT 27 (SUTURE) ×2 IMPLANT
TRAY FOLEY MTR SLVR 14FR STAT (SET/KITS/TRAYS/PACK) IMPLANT
TRAY FOLEY MTR SLVR 16FR STAT (SET/KITS/TRAYS/PACK) IMPLANT
YANKAUER SUCT BULB TIP NO VENT (SUCTIONS) ×1 IMPLANT

## 2023-09-26 NOTE — Interval H&P Note (Signed)
History and Physical Interval Note: The patient understands that she is here today for a left total hip replacement to treat her severe left hip arthritis.  There is been no acute or interval change in her medical status.  The risks and benefits of surgery have been discussed in detail and informed consent has been obtained.  The left operative hip has been marked.  09/26/2023 9:59 AM  Marie Davis  has presented today for surgery, with the diagnosis of severe osteoarthritis left hip.  The various methods of treatment have been discussed with the patient and family. After consideration of risks, benefits and other options for treatment, the patient has consented to  Procedure(s): LEFT TOTAL HIP ARTHROPLASTY ANTERIOR APPROACH (Left) as a surgical intervention.  The patient's history has been reviewed, patient examined, no change in status, stable for surgery.  I have reviewed the patient's chart and labs.  Questions were answered to the patient's satisfaction.     Kathryne Hitch

## 2023-09-26 NOTE — Anesthesia Postprocedure Evaluation (Signed)
Anesthesia Post Note  Patient: Marie Davis  Procedure(s) Performed: LEFT TOTAL HIP ARTHROPLASTY ANTERIOR APPROACH (Left: Hip)     Patient location during evaluation: PACU Anesthesia Type: MAC and Spinal Level of consciousness: awake and alert Pain management: pain level controlled Vital Signs Assessment: post-procedure vital signs reviewed and stable Respiratory status: spontaneous breathing, nonlabored ventilation, respiratory function stable and patient connected to nasal cannula oxygen Cardiovascular status: stable and blood pressure returned to baseline Postop Assessment: no apparent nausea or vomiting Anesthetic complications: no   No notable events documented.  Last Vitals:  Vitals:   09/26/23 1524 09/26/23 1750  BP: (!) 127/102 124/74  Pulse: (!) 58 70  Resp: 18 18  Temp: 36.5 C 36.6 C  SpO2: 99% 99%    Last Pain:  Vitals:   09/26/23 1851  TempSrc:   PainSc: 3                  Danen Lapaglia P Savilla Turbyfill

## 2023-09-26 NOTE — Evaluation (Signed)
Physical Therapy Evaluation Patient Details Name: Marie Davis MRN: [id number] DOB: [birth year] Today's Date: 09/26/2023  History of Present Illness  52 yo female presents to therapy s/p L THA, anterior approach on 09/26/2023 due to failure of conservative measures. Pt PMH includes but is not limited to: OA, URI, migraines, GERD, HTN, asthma and gastric bypass.  Clinical Impression      Marie Davis is a 52 y.o. female POD 0 s/p L THA, AA. Patient reports mod I with mobility at baseline. Patient is now limited by functional impairments (see PT problem list below) and requires min A for LLE  for bed mobility and CGA and cues for sit to stand  and for SPT. Patient was unable to safely ambulate at time of eval due to reports of increased pain, nausea, light headedness and feeling hot. Pt expressed concern per car transfer with elevated vehicle as well as height of bed and spouse purchasing a step stool for use with car and bed transfers. PT initiated instruction of use of step stool for both transfers.  Patient will benefit from continued skilled PT interventions to address impairments and progress towards PLOF. Acute PT will follow to progress mobility and stair training in preparation for safe discharge home with family assist and [facility] services.  1524  BP at rest with nursing 127/102 Bp s/p seated in recliner with B LE elevated 101/69 (48 PR)     If plan is discharge home, recommend the following: A little help with walking and/or transfers;A little help with bathing/dressing/bathroom;Assistance with cooking/housework;Assist for transportation;Help with stairs or ramp for entrance   Can travel by private vehicle        Equipment Recommendations Rolling walker (2 wheels)  Recommendations for Other Services       Functional Status Assessment Patient has had a recent decline in their functional status and demonstrates the ability to make significant improvements in function in a reasonable and  predictable amount of time.     Precautions / Restrictions Precautions Precautions: Fall Restrictions Weight Bearing Restrictions Per Provider Order: No      Mobility  Bed Mobility Overal bed mobility: Needs Assistance Bed Mobility: Supine to Sit     Supine to sit: Min assist, HOB elevated, Used rails     General bed mobility comments: min A for L LE to EOB, increased time and cues, pt required seated rest break prior to making to EOB due to pain response with pt at rest 5/10 and increasing to 8/10 with mobility    Transfers Overall transfer level: Needs assistance Equipment used: Rolling walker (2 wheels) Transfers: Sit to/from Stand, Bed to chair/wheelchair/BSC Sit to Stand: Contact guard assist, From elevated surface Stand pivot transfers: Contact guard assist         General transfer comment: cues for safety and technique. pt reported feeling light headed and nauseated and hot during pre-gait tasks and directed to recliner. BP and pt assessed with pt positioned in semi reclined with B LE elevated in recliner    Ambulation/Gait               General Gait Details: NT pt reported feeling increased pain, light headed, nauseated and hot  Stairs            Wheelchair Mobility     Tilt Bed    Modified Rankin (Stroke Patients Only)       Balance Overall balance assessment: Needs assistance Sitting-balance support: Feet supported Sitting balance-Leahy Scale: Good  Standing balance support: Bilateral upper extremity supported, During functional activity, Reliant on assistive device for balance Standing balance-Leahy Scale: Poor                               Pertinent Vitals/Pain Pain Assessment Pain Assessment: 0-10 Pain Score: 8  Pain Location: L hip, LE and knee Pain Descriptors / Indicators: Aching, Burning, Constant, Discomfort, Grimacing, Operative site guarding Pain Intervention(s): Limited activity within patient's  tolerance, Monitored during session, Premedicated before session, Repositioned, Ice applied, Patient requesting pain meds-RN notified    Home Living Family/patient expects to be discharged to:: Private residence Living Arrangements: Spouse/significant other Available Help at Discharge: Family Type of Home: House Home Access: Stairs to enter Entrance Stairs-Rails: Left (no handrial for the first step) Entrance Stairs-Number of Steps: 1 + 3   Home Layout: One level Home Equipment: Cane - single point      Prior Function Prior Level of Function : Independent/Modified Independent;Driving             Mobility Comments: mod I with use of SPC for the past 2 months with all functional mobiltiy tasks, mod I for ADLs, self care and IADLs       Extremity/Trunk Assessment        Lower Extremity Assessment Lower Extremity Assessment: LLE deficits/detail LLE Deficits / Details: ankle DF/PF 5/5 LLE Sensation: decreased light touch;decreased proprioception (groin and buttock region)    Cervical / Trunk Assessment Cervical / Trunk Assessment: Normal  Communication   Communication Communication: No apparent difficulties  Cognition Arousal: Alert Behavior During Therapy: WFL for tasks assessed/performed Overall Cognitive Status: Within Functional Limits for tasks assessed                                          General Comments      Exercises     Assessment/Plan    PT Assessment Patient needs continued PT services  PT Problem List Decreased strength;Decreased range of motion;Decreased activity tolerance;Decreased balance;Decreased mobility;Decreased coordination;Decreased cognition;Pain       PT Treatment Interventions DME instruction;Gait training;Stair training;Functional mobility training;Therapeutic activities;Therapeutic exercise;Balance training;Neuromuscular re-education;Patient/family education;Modalities    PT Goals (Current goals can be found in  the Care Plan section)  Acute Rehab PT Goals Patient Stated Goal: to be able to walk normally without AD and be more active as well as play with grandchild PT Goal Formulation: With patient Time For Goal Achievement: 10/10/23 Potential to Achieve Goals: Good    Frequency 7X/week     Co-evaluation               AM-PAC PT "6 Clicks" Mobility  Outcome Measure Help needed turning from your back to your side while in a flat bed without using bedrails?: A Little Help needed moving from lying on your back to sitting on the side of a flat bed without using bedrails?: A Little Help needed moving to and from a bed to a chair (including a wheelchair)?: A Little Help needed standing up from a chair using your arms (e.g., wheelchair or bedside chair)?: A Little Help needed to walk in hospital room?: Total Help needed climbing 3-5 steps with a railing? : Total 6 Click Score: 14    End of Session Equipment Utilized During Treatment: Gait belt Activity Tolerance: Patient limited by pain;Treatment limited secondary to medical  complications (Comment) (signs and symptoms of orthostatic hypotension) Patient left: in chair;with call bell/phone within reach;with chair alarm set;with family/visitor present Nurse Communication: Mobility status;Other (comment);Patient requests pain meds (pt physiolofical reponse and pain) PT Visit Diagnosis: Unsteadiness on feet (R26.81);Other abnormalities of gait and mobility (R26.89);Muscle weakness (generalized) (M62.81);Difficulty in walking, not elsewhere classified (R26.2);Pain Pain - Right/Left: Left Pain - part of body: Hip;Knee;Leg    Time: 7829-5621 PT Time Calculation (min) (ACUTE ONLY): 25 min   Charges:   PT Evaluation $PT Eval Low Complexity: 1 Low PT Treatments $Therapeutic Activity: 8-22 mins PT General Charges $$ ACUTE PT VISIT: 1 Visit         Johnny Bridge, PT Acute Rehab   Jacqualyn Posey 09/26/2023, 5:44 PM

## 2023-09-26 NOTE — Plan of Care (Signed)
  Problem: Education: Goal: Knowledge of General Education information will improve Description: Including pain rating scale, medication(s)/side effects and non-pharmacologic comfort measures Outcome: Progressing   Problem: Coping: Goal: Level of anxiety will decrease Outcome: Progressing   Problem: Pain Management: Goal: General experience of comfort will improve Outcome: Progressing

## 2023-09-26 NOTE — Op Note (Signed)
Operative Note  Date of operation: 09/26/2023 Preoperative diagnosis: Left hip primary osteoarthritis Postoperative diagnosis: Same  Procedure: Left direct anterior total hip arthroplasty  Implants: Implant Name Type Inv. Item Serial No. Manufacturer Lot No. LRB No. Used Action  CUP SECTOR GRIPTON - [id number] Cup CUP SECTOR GRIPTON  [employer] [id number] Left 1 Implanted  PINNACLE PLUS 4 NEUTRAL - [id number] Hips PINNACLE PLUS 4 NEUTRAL  DEPUY ORTH[employer] [id number] Left 1 Implanted  STEM FEM ACTIS HIGH SZ1 - [id number] Stem STEM FEM ACTIS HIGH SZ1  DEPU[employer]TH[employer] [id number] Left 1 Implanted  HEAD FEMORAL 32 CERAMIC - [id number] Hips HEAD FEMORAL 32 CERAMIC  DEPU[employer] [id number] Left 1 Implanted   Surgeon: Vanita Panda. Magnus Ivan, MD Assistant: Rexene Edison, PA-C  Anesthesia: Spinal EBL: 300 cc Antibiotics: IV Ancef Complications: None  Indications: The patient is a 52 year old female with debilitating arthritis involving her left hip.  This is been seeing on imaging studies including a MRI of her left hip.  Her pain is 10 out of 10 without left hip and it is daily.  It is detrimentally affecting her mobility, her quality of life and her actives daily living.  At this point we have recommended a total hip arthroplasty.  She has been on a weight loss journey and her BMI was significantly high before.  It is still over 40 at 16 and she understands she has a significantly heightened risk of complications with the surgery given her morbid obesity.  These include the risks of acute blood loss anemia, nerve or vessel injury, infection, DVT, dislocation, implant failure, leg length differences and wound healing issues.  She understands that our goals are hopefully decreased pain, improved mobility and improved quality of life.  Procedure description: After informed consent was obtained and the appropriate left hip was marked, the patient was brought to the operating room and set  up on the stretcher where spinal anesthesia was obtained.  She was then laid in a supine position on the stretcher and a Foley catheter is placed.  Traction boots were placed on both feet and then she was placed supine on the Hana fracture table with a perineal post in place in both legs and inline skeletal traction devices but no traction applied.  Her left operative hip and pelvis were assessed radiographically.  The left hip was prepped and draped with DuraPrep and sterile drapes.  A timeout was called and she was then about the correct patient correct left hip.  An incision was then made just inferior and posterior to the ASIS and carried down through the adipose tissue to the tensor fascia lata muscle.  The tensor fascia was then divided longitudinally to proceed with a direct interposed the hip.  Circumflex vessels were identified and cauterized.  The hip capsule was identified and opened up in L-type format finding a moderate joint effusion.  Cobra retractors were placed around the medial and lateral femoral neck and a femoral neck cut was made with an oscillating saw just proximal to the lesser trochanter.  This was completed with an osteotome.  A corkscrew was placed in the femoral head femoral head was removed in its entirety and there was a wide area devoid of cartilage.  A bent Hohmann was then placed over the medial acetabular rim and remnants of the acetabular labrum and other debris removed.  Reaming was then initiated under direct visualization from a size 43 reamer going and stepwise increments going up to a size 50 reamer with  all reamers placed under direct visualization and the last reamer placed under direct fluoroscopy in order to obtain the depth reaming, the inclination and anteversion.  We then placed the real DePuy sector GRIPTION acetabular component size 50 without difficulty followed by 32+4 polythene liner.  Attention was then turned to the femur.  With the left leg externally rotated  to 120 degrees, extended and adducted, a Mueller retractor was placed medially and Hohmann retractors placed behind the greater trochanter.  The lateral joint capsule was released and a box cutting osteotome was used in her femoral canal.  Broaching was then initiated using the Actis broaching system from a size 0 going to only a size 1.  She had a very tight canal and thick cortical bone.  With a size 1 trial in place we trialed a standard offset femoral neck and a 32+1 trial hip ball.  We brought the left leg over and up and with traction and internal rotation reduced this in the pelvis and we felt like we needed more offset.  We dislocated the hip remove the trial components.  We placed the real Actis femoral component size 1 with high offset and the real 32+1 ceramic head ball.  We reduces in acetabulum and felt stable on exam and we are pleased with leg length and offset.  I do feel like she is little bit longer but I am concerned about stability given her morbid obesity as well.  We then irrigated the soft tissue with normal saline solution.  Remnants of the joint capsule were closed with interrupted #1 Ethibond suture followed by #1 Vicryl close the tensor fascia.  0 Vicryl was used to close the deep tissue and 2-0 Vicryl used to close subcutaneous tissue.  The skin was closed with interrupted 2-0 nylon suture.  An Aquacel dressing was applied.  The patient was taken the recovery room in stable condition.  Rexene Edison, PA-C did assist during entire case and beginning the end and his assistance was medically necessary and crucial for soft tissue management and retraction, helping guide implant placement and a layered closure of the wound.  Again this case was incredibly difficult given the patient's high BMI.

## 2023-09-26 NOTE — Anesthesia Procedure Notes (Signed)
Procedure Name: MAC Date/Time: 09/26/2023 11:42 AM  Performed by: Vanessa [place], CRNAPre-anesthesia Checklist: Patient identified, Emergency Drugs available, Suction available and Patient being monitored Patient Re-evaluated:Patient Re-evaluated prior to induction Oxygen Delivery Method: Simple face mask

## 2023-09-26 NOTE — Transfer of Care (Signed)
Immediate Anesthesia Transfer of Care Note  Patient: Marie Davis  Procedure(s) Performed: LEFT TOTAL HIP ARTHROPLASTY ANTERIOR APPROACH (Left: Hip)  Patient Location: PACU  Anesthesia Type:Spinal  Level of Consciousness: awake and patient cooperative  Airway & Oxygen Therapy: Patient Spontanous Breathing and Patient connected to face mask  Post-op Assessment: Report given to RN and Post -op Vital signs reviewed and stable  Post vital signs: Reviewed and stable  Last Vitals:  Vitals Value Taken Time  BP 106/59 09/26/23 1323  Temp    Pulse 87 09/26/23 1325  Resp 17 09/26/23 1325  SpO2 100 % 09/26/23 1325  Vitals shown include unfiled device data.  Last Pain:  Vitals:   09/26/23 0926  TempSrc:   PainSc: 4          Complications: No notable events documented.

## 2023-09-26 NOTE — Anesthesia Preprocedure Evaluation (Signed)
Anesthesia Evaluation  Patient identified by MRN, date of birth, ID band Patient awake    Reviewed: Allergy & Precautions, NPO status , Patient's Chart, lab work & pertinent test results  History of Anesthesia Complications (+) PONV and history of anesthetic complications  Airway Mallampati: II  TM Distance: >3 FB Neck ROM: Full    Dental no notable dental hx.    Pulmonary asthma    Pulmonary exam normal        Cardiovascular hypertension, Pt. on medications  Rhythm:Regular Rate:Normal     Neuro/Psych  Headaches  negative psych ROS   GI/Hepatic Neg liver ROS,GERD  ,,  Endo/Other  negative endocrine ROS    Renal/GU negative Renal ROS  negative genitourinary   Musculoskeletal  (+) Arthritis , Osteoarthritis,    Abdominal Normal abdominal exam  (+)   Peds  Hematology Lab Results      Component                Value               Date                      WBC                      7.1                 09/17/2023                HGB                      13.5                09/17/2023                HCT                      40.2                09/17/2023                MCV                      83.2                09/17/2023                PLT                      281                 09/17/2023              Anesthesia Other Findings   Reproductive/Obstetrics                             Anesthesia Physical Anesthesia Plan  ASA: 2  Anesthesia Plan: MAC and Spinal   Post-op Pain Management:    Induction: Intravenous  PONV Risk Score and Plan: 3 and Ondansetron, Dexamethasone, Propofol infusion, Midazolam and Treatment may vary due to age or medical condition  Airway Management Planned: Simple Face Mask and Nasal Cannula  Additional Equipment: None  Intra-op Plan:   Post-operative Plan:   Informed Consent: I have reviewed the patients History and Physical, chart, labs and discussed the  procedure including the risks,  benefits and alternatives for the proposed anesthesia with the patient or authorized representative who has indicated his/her understanding and acceptance.     Dental advisory given  Plan Discussed with: CRNA  Anesthesia Plan Comments:        Anesthesia Quick Evaluation

## 2023-09-26 NOTE — Anesthesia Procedure Notes (Addendum)
Spinal  Patient location during procedure: OR Start time: 09/26/2023 11:22 AM End time: 09/26/2023 11:24 AM Staffing Performed: anesthesiologist  Anesthesiologist: Atilano Median, DO Performed by: Atilano Median, DO Authorized by: Atilano Median, DO   Preanesthetic Checklist Completed: patient identified, IV checked, site marked, risks and benefits discussed, surgical consent, monitors and equipment checked, pre-op evaluation and timeout performed Spinal Block Patient position: sitting Prep: DuraPrep Patient monitoring: heart rate, cardiac monitor, continuous pulse ox and blood pressure Approach: midline Location: L4-5 Injection technique: single-shot Needle Needle type: Pencan  Needle gauge: 24 G Needle length: 10 cm Assessment Events: CSF return Additional Notes Patient identified. Risks/Benefits/Options discussed with patient including but not limited to bleeding, infection, nerve damage, paralysis, failed block, incomplete pain control, headache, blood pressure changes, nausea, vomiting, reactions to medications, itching and postpartum back pain. Confirmed with bedside nurse the patient's most recent platelet count. Confirmed with patient that they are not currently taking any anticoagulation, have any bleeding history or any family history of bleeding disorders. Patient expressed understanding and wished to proceed. All questions were answered. Sterile technique was used throughout the entire procedure. Please see nursing notes for vital signs. Warning signs of high block given to the patient including shortness of breath, tingling/numbness in hands, complete motor block, or any concerning symptoms with instructions to call for help. Patient was given instructions on fall risk and not to get out of bed. All questions and concerns addressed with instructions to call with any issues or inadequate analgesia.

## 2023-09-27 DIAGNOSIS — M1612 Unilateral primary osteoarthritis, left hip: Secondary | ICD-10-CM | POA: Diagnosis not present

## 2023-09-27 LAB — CBC
HCT: 35 % — ABNORMAL LOW (ref 36.0–46.0)
Hemoglobin: 11.4 g/dL — ABNORMAL LOW (ref 12.0–15.0)
MCH: 28.2 pg (ref 26.0–34.0)
MCHC: 32.6 g/dL (ref 30.0–36.0)
MCV: 86.6 fL (ref 80.0–100.0)
Platelets: 246 10*3/uL (ref 150–400)
RBC: 4.04 MIL/uL (ref 3.87–5.11)
RDW: 13.2 % (ref 11.5–15.5)
WBC: 15.2 10*3/uL — ABNORMAL HIGH (ref 4.0–10.5)
nRBC: 0 % (ref 0.0–0.2)

## 2023-09-27 LAB — BASIC METABOLIC PANEL
Anion gap: 9 (ref 5–15)
BUN: 16 mg/dL (ref 6–20)
CO2: 24 mmol/L (ref 22–32)
Calcium: 8 mg/dL — ABNORMAL LOW (ref 8.9–10.3)
Chloride: 103 mmol/L (ref 98–111)
Creatinine, Ser: 0.56 mg/dL (ref 0.44–1.00)
GFR, Estimated: >60 mL/min (ref >60)
Glucose, Bld: 127 mg/dL — ABNORMAL HIGH (ref 70–99)
Potassium: 3.9 mmol/L (ref 3.5–5.1)
Sodium: 136 mmol/L (ref 135–145)

## 2023-09-27 MED ORDER — ASPIRIN 81 MG PO CHEW: 81.0000 mg | CHEWABLE_TABLET | Freq: Two times a day (BID) | ORAL | 0 refills | Status: AC

## 2023-09-27 MED ORDER — METHOCARBAMOL 500 MG PO TABS: 500.0000 mg | ORAL_TABLET | Freq: Four times a day (QID) | ORAL | 1 refills | Status: DC | PRN

## 2023-09-27 MED ORDER — LISINOPRIL 10 MG PO TABS
10.0000 mg | ORAL_TABLET | Freq: Every day | ORAL | Status: DC
Start: 2023-09-27 — End: 2023-09-27
  Filled 2023-09-27: qty 1

## 2023-09-27 MED ORDER — OXYCODONE HCL 5 MG PO TABS: 5.0000 mg | ORAL_TABLET | Freq: Four times a day (QID) | ORAL | 0 refills | Status: AC | PRN

## 2023-09-27 NOTE — Progress Notes (Signed)
Physical Therapy Treatment Patient Details Name: Marie Davis MRN: [id number] DOB: Jan 21, 1971 Today's Date: 09/27/2023   History of Present Illness 52 yo female presents to therapy s/p L THA, anterior approach on 09/26/2023 due to failure of conservative measures. Pt PMH includes but is not limited to: OA, URI, migraines, GERD, HTN, asthma and gastric bypass.    PT Comments  POD # 1 am session General Comments: AxO x 3 pleasantand motivated. Married.  Current teacher on winter break. Assisted OOB to amb to bathroom required increased time and effort.   General transfer comment: 25% VC's on proper tech, proper hand placement and safety with turns using walker.  Also assisted with a toilet transfer.  Required increased time due to pain. General Gait Details: Limited amb distance 8 feet x 2 to and from bathroom.  Distance limited by effort and pain level.  Great difficulty advancing L LE. Assisted to recliner then applied ICE. Pt will need another PT session to increase amb distance as well as Educate on HEP.    If plan is discharge home, recommend the following: A little help with walking and/or transfers;A little help with bathing/dressing/bathroom;Assistance with cooking/housework;Assist for transportation;Help with stairs or ramp for entrance   Can travel by private vehicle        Equipment Recommendations  Rolling walker (2 wheels)    Recommendations for Other Services       Precautions / Restrictions Precautions Precautions: Fall Restrictions Weight Bearing Restrictions Per Provider Order: No Other Position/Activity Restrictions: WBAT     Mobility  Bed Mobility Overal bed mobility: Needs Assistance Bed Mobility: Supine to Sit     Supine to sit: Supervision, Contact guard     General bed mobility comments: demonstarted and instructed how to use belt to self assist LE.  Required increased time.    Transfers Overall transfer level: Needs assistance Equipment used:  Rolling walker (2 wheels) Transfers: Sit to/from Stand Sit to Stand: Supervision, Contact guard assist           General transfer comment: 25% VC's on proper tech, proper hand placement and safety with turns using walker.  Also assisted with a toilet transfer.  Required increased time due to pain.    Ambulation/Gait Ambulation/Gait assistance: Contact guard assist, Supervision Gait Distance (Feet): 16 Feet (8 feet x 2 to and from bathroom) Assistive device: Rolling walker (2 wheels) Gait Pattern/deviations: Step-to pattern, Decreased stance time - left Gait velocity: decreased     General Gait Details: Limited amb distance 8 feet x 2 to and from bathroom.  Distance limited by effort and pain level.  Great difficulty advancing L LE.   Stairs             Wheelchair Mobility     Tilt Bed    Modified Rankin (Stroke Patients Only)       Balance                                            Cognition Arousal: Alert Behavior During Therapy: WFL for tasks assessed/performed Overall Cognitive Status: Within Functional Limits for tasks assessed                                 General Comments: AxO x 3 pleasantand motivated. Married.  Current teacher on winter break.  Exercises      General Comments        Pertinent Vitals/Pain Pain Assessment Pain Assessment: 0-10 Pain Score: 7  Pain Location: L hip Pain Descriptors / Indicators: Aching, Burning, Constant, Discomfort, Grimacing, Operative site guarding Pain Intervention(s): Monitored during session, Premedicated before session, Repositioned, Ice applied    Home Living                          Prior Function            PT Goals (current goals can now be found in the care plan section) Progress towards PT goals: Progressing toward goals    Frequency    7X/week      PT Plan      Co-evaluation              AM-PAC PT "6 Clicks" Mobility    Outcome Measure  Help needed turning from your back to your side while in a flat bed without using bedrails?: A Little Help needed moving from lying on your back to sitting on the side of a flat bed without using bedrails?: A Little Help needed moving to and from a bed to a chair (including a wheelchair)?: A Little Help needed standing up from a chair using your arms (e.g., wheelchair or bedside chair)?: A Little Help needed to walk in hospital room?: A Little Help needed climbing 3-5 steps with a railing? : A Lot 6 Click Score: 17    End of Session Equipment Utilized During Treatment: Gait belt Activity Tolerance: Patient limited by pain;Treatment limited secondary to medical complications (Comment) Patient left: in chair;with call bell/phone within reach;with chair alarm set;with family/visitor present Nurse Communication: Mobility status;Other (comment);Patient requests pain meds PT Visit Diagnosis: Unsteadiness on feet (R26.81);Other abnormalities of gait and mobility (R26.89);Muscle weakness (generalized) (M62.81);Difficulty in walking, not elsewhere classified (R26.2);Pain Pain - Right/Left: Left Pain - part of body: Hip;Knee;Leg     Time: 1610-9604 PT Time Calculation (min) (ACUTE ONLY): 28 min  Charges:    $Gait Training: 8-22 mins $Therapeutic Activity: 8-22 mins PT General Charges $$ ACUTE PT VISIT: 1 Visit                     Felecia Shelling  PTA Acute  Rehabilitation Services Office M-F          [phone]

## 2023-09-27 NOTE — Discharge Summary (Signed)
Patient ID: AZELL GASCHLER MRN: [id number] DOB/AGE: 12-28-1970 52 y.o.  Admit date: 09/26/2023 Discharge date: 09/27/2023  Admission Diagnoses:  Principal Problem:   Status post total replacement of left hip Active Problems:   Unilateral primary osteoarthritis, left hip   Discharge Diagnoses:  Same  Past Medical History:  Diagnosis Date   Asthma    GERD (gastroesophageal reflux disease)    rare   Hypertension    Migraines    PONV (postoperative nausea and vomiting)    Pre-diabetes    Recurrent sinus infections     Surgeries: Procedure(s): LEFT TOTAL HIP ARTHROPLASTY ANTERIOR APPROACH on 09/26/2023   Consultants:   Discharged Condition: Improved  Hospital Course: Marie Davis is an 52 y.o. female who was admitted 09/26/2023 for operative treatment ofStatus post total replacement of left hip. Patient has severe unremitting pain that affects sleep, daily activities, and work/hobbies. After pre-op clearance the patient was taken to the operating room on 09/26/2023 and underwent  Procedure(s): LEFT TOTAL HIP ARTHROPLASTY ANTERIOR APPROACH.    Patient was given perioperative antibiotics:  Anti-infectives (From admission, onward)    Start     Dose/Rate Route Frequency Ordered Stop   09/26/23 1800  ceFAZolin (ANCEF) IVPB 2g/100 mL premix        2 g 200 mL/hr over 30 Minutes Intravenous Every 6 hours 09/26/23 1522 09/27/23 0033   09/26/23 0930  ceFAZolin (ANCEF) IVPB 2g/100 mL premix        2 g 200 mL/hr over 30 Minutes Intravenous On call to O.R. 09/26/23 0915 09/26/23 1136        Patient was given sequential compression devices, early ambulation, and chemoprophylaxis to prevent DVT.  Patient benefited maximally from hospital stay and there were no complications.    Recent vital signs: Patient Vitals for the past 24 hrs:  BP Temp Temp src Pulse Resp SpO2  09/27/23 0947 110/78 98 F (36.7 C) Oral 64 16 99 %  09/27/23 0702 113/65 98.6 F (37 C) Oral 70 16 99 %   09/27/23 0244 126/76 98.2 F (36.8 C) Oral 64 15 98 %  09/26/23 2217 119/66 98.3 F (36.8 C) Oral 64 16 99 %     Recent laboratory studies:  Recent Labs    09/26/23 1431 09/27/23 0347  WBC 14.1* 15.2*  HGB 12.8 11.4*  HCT 39.2 35.0*  PLT 251 246  NA 139 136  K 4.0 3.9  CL 104 103  CO2 26 24  BUN 16 16  CREATININE 0.55 0.56  GLUCOSE 116* 127*  CALCIUM 8.6* 8.0*     Discharge Medications:   Allergies as of 09/27/2023       Reactions   Neosporin [neomycin-polymyxin-gramicidin]    Break outs   Tape    Band-aids cause a rash        Medication List     TAKE these medications    acetaminophen 500 MG tablet Commonly known as: TYLENOL Take 1,000 mg by mouth every 6 (six) hours as needed for moderate pain (pain score 4-6).   acetaminophen 650 MG CR tablet Commonly known as: TYLENOL Take 1,300 mg by mouth every 8 (eight) hours as needed for pain.   Albuterol Sulfate 108 (90 Base) MCG/ACT Aepb Commonly known as: PROAIR RESPICLICK Inhale 1-2 puffs into the lungs every 4 (four) hours as needed.   aspirin 81 MG chewable tablet Chew 1 tablet (81 mg total) by mouth 2 (two) times daily.   celecoxib 200 MG capsule Commonly known as:  CELEBREX Take 1 capsule (200 mg total) by mouth 2 (two) times daily between meals as needed. What changed: when to take this   cetirizine 10 MG tablet Commonly known as: ZYRTEC Take 10 mg by mouth daily.   FLINTSTONES GUMMIES COMPLETE PO Take 1 tablet by mouth at bedtime.   lisinopril-hydrochlorothiazide 10-12.5 MG tablet Commonly known as: ZESTORETIC Take 1 tablet by mouth daily.   methocarbamol 500 MG tablet Commonly known as: ROBAXIN Take 1 tablet (500 mg total) by mouth every 6 (six) hours as needed for muscle spasms.   oxyCODONE 5 MG immediate release tablet Commonly known as: Oxy IR/ROXICODONE Take 1-2 tablets (5-10 mg total) by mouth every 6 (six) hours as needed for moderate pain (pain score 4-6) (pain score 4-6).    phentermine 37.5 MG tablet Commonly known as: ADIPEX-P Take 37.5 mg by mouth daily.   Vitamin D3 Ultra Strength 125 MCG (5000 UT) capsule Generic drug: Cholecalciferol Take 5,000 Units by mouth daily.               Durable Medical Equipment  (From admission, onward)           Start     Ordered   09/26/23 1523  DME 3 n 1  Once        09/26/23 1522   09/26/23 1523  DME Walker rolling  Once       Question Answer Comment  Walker: With 5 Inch Wheels   Patient needs a walker to treat with the following condition Status post total replacement of left hip      09/26/23 1522            Diagnostic Studies: DG Pelvis Portable Result Date: 09/26/2023 CLINICAL DATA:  Status post left hip replacement. EXAM: PORTABLE PELVIS 1-2 VIEWS COMPARISON:  None Available. FINDINGS: Left hip arthroplasty in expected alignment. No periprosthetic lucency or fracture. Recent postsurgical change includes air and edema in the soft tissues. IMPRESSION: Left hip arthroplasty without immediate postoperative complication. Electronically Signed   By: Narda Rutherford M.D.   On: 09/26/2023 17:22   DG HIP UNILAT WITH PELVIS 1V LEFT Result Date: 09/26/2023 CLINICAL DATA:  Total left hip arthroplasty. Intraoperative fluoroscopy. EXAM: DG HIP (WITH OR WITHOUT PELVIS) 1V*L* COMPARISON:  Pelvis and left hip radiographs 04/03/2023 FINDINGS: Images were performed intraoperatively without the presence of a radiologist. The patient is undergoing total left hip arthroplasty. No hardware complication is seen. Total fluoroscopy images: 4 Total fluoroscopy time: 24 seconds Total dose: Radiation Exposure Index (as provided by the fluoroscopic device): 5.7 mGy air Kerma Please see intraoperative findings for further detail. IMPRESSION: Intraoperative fluoroscopy for total left hip arthroplasty. Electronically Signed   By: Neita Garnet M.D.   On: 09/26/2023 16:01   DG C-Arm 1-60 Min-No Report Result Date:  09/26/2023 Fluoroscopy was utilized by the requesting physician.  No radiographic interpretation.   DG C-Arm 1-60 Min-No Report Result Date: 09/26/2023 Fluoroscopy was utilized by the requesting physician.  No radiographic interpretation.    Disposition: Discharge disposition: 01-Home or Self Care          Follow-up Information     Kathryne Hitch, MD Follow up in 2 week(s).   Specialty: Orthopedic Surgery Contact information: [address] [place] [address] [phone]                  Signed: Kathryne Hitch 09/27/2023, 6:47 PM

## 2023-09-27 NOTE — Progress Notes (Signed)
Physical Therapy Treatment Patient Details Name: Marie Davis MRN: [id number] DOB: 1971-07-19 Today's Date: 09/27/2023   History of Present Illness 52 yo female presents to therapy s/p L THA, anterior approach on 09/26/2023 due to failure of conservative measures. Pt PMH includes but is not limited to: OA, URI, migraines, GERD, HTN, asthma and gastric bypass.    PT Comments  POD # 1 pm session Spouse present.  Had Spouse assist pt OOB to amb in hallway, practice stairs then assisted to bathroom.  Reviewed HEP following handout. Addressed all mobility questions, discussed appropriate activity, educated on use of ICE.  Pt ready for D/C to home.    If plan is discharge home, recommend the following: A little help with walking and/or transfers;A little help with bathing/dressing/bathroom;Assistance with cooking/housework;Assist for transportation;Help with stairs or ramp for entrance   Can travel by private vehicle        Equipment Recommendations  Rolling walker (2 wheels)    Recommendations for Other Services       Precautions / Restrictions Precautions Precautions: Fall Restrictions Weight Bearing Restrictions Per Provider Order: No Other Position/Activity Restrictions: WBAT     Mobility  Bed Mobility Overal bed mobility: Needs Assistance Bed Mobility: Supine to Sit, Sit to Supine     Supine to sit: Supervision Sit to supine: Supervision, Contact guard assist   General bed mobility comments: demonstarted and instructed how to use belt to self assist LE.  Required increased time.    Transfers Overall transfer level: Needs assistance Equipment used: Rolling walker (2 wheels) Transfers: Sit to/from Stand Sit to Stand: Supervision, Contact guard assist           General transfer comment: 25% VC's on proper tech, proper hand placement and safety with turns using walker.  Also assisted with a toilet transfer.  Required increased time due to pain.     Ambulation/Gait Ambulation/Gait assistance: Contact guard assist, Supervision Gait Distance (Feet): 45 Feet Assistive device: Rolling walker (2 wheels) Gait Pattern/deviations: Step-to pattern, Decreased stance time - left Gait velocity: decreased     General Gait Details: tolerated an increased amb distance.  Had Spouse "hands on" assist using safety belt.   Stairs Stairs: Yes Stairs assistance: Supervision, Contact guard assist   Number of Stairs: 3 General stair comments: first up ONE step forward with walker then second up 2 steps ONE rail on LEFT with Spouse "hands on" assisted using safety belt and 25% VC's on proper tech with each stair situation.   Wheelchair Mobility     Tilt Bed    Modified Rankin (Stroke Patients Only)       Balance                                            Cognition Arousal: Alert Behavior During Therapy: WFL for tasks assessed/performed Overall Cognitive Status: Within Functional Limits for tasks assessed                                 General Comments: AxO x 3 pleasantand motivated. Married.  Current teacher on winter break.        Exercises      General Comments        Pertinent Vitals/Pain Pain Assessment Pain Assessment: 0-10 Pain Score: 7  Pain Location: L hip Pain Descriptors /  Indicators: Aching, Burning, Constant, Discomfort, Grimacing, Operative site guarding Pain Intervention(s): Monitored during session, Premedicated before session, Repositioned, Ice applied    Home Living                          Prior Function            PT Goals (current goals can now be found in the care plan section) Progress towards PT goals: Progressing toward goals    Frequency    7X/week      PT Plan      Co-evaluation              AM-PAC PT "6 Clicks" Mobility   Outcome Measure  Help needed turning from your back to your side while in a flat bed without using  bedrails?: A Little Help needed moving from lying on your back to sitting on the side of a flat bed without using bedrails?: A Little Help needed moving to and from a bed to a chair (including a wheelchair)?: A Little Help needed standing up from a chair using your arms (e.g., wheelchair or bedside chair)?: A Little Help needed to walk in hospital room?: A Little Help needed climbing 3-5 steps with a railing? : A Lot 6 Click Score: 17    End of Session Equipment Utilized During Treatment: Gait belt Activity Tolerance: Patient limited by pain;Treatment limited secondary to medical complications (Comment) Patient left: in chair;with call bell/phone within reach;with chair alarm set;with family/visitor present Nurse Communication: Mobility status;Other (comment);Patient requests pain meds PT Visit Diagnosis: Unsteadiness on feet (R26.81);Other abnormalities of gait and mobility (R26.89);Muscle weakness (generalized) (M62.81);Difficulty in walking, not elsewhere classified (R26.2);Pain Pain - Right/Left: Left Pain - part of body: Hip;Knee;Leg     Time: 9528-4132 PT Time Calculation (min) (ACUTE ONLY): 32 min  Charges:    $Gait Training: 8-22 mins $Therapeutic Activity: 8-22 mins PT General Charges $$ ACUTE PT VISIT: 1 Visit                     Felecia Shelling  PTA Acute  Rehabilitation Services Office M-F          [phone]

## 2023-09-27 NOTE — Progress Notes (Signed)
Subjective: 1 Day Post-Op Procedure(s) (LRB): LEFT TOTAL HIP ARTHROPLASTY ANTERIOR APPROACH (Left) Patient reports pain as moderate.  She has had some lightheadedness when she was up.  Her H&H and vitals are stable.  Objective: Vital signs in last 24 hours: Temp:  [97.3 F (36.3 C)-98.6 F (37 C)] 98 F (36.7 C) (12/21 0947) Pulse Rate:  [46-91] 64 (12/21 0947) Resp:  [11-20] 16 (12/21 0947) BP: (101-140)/(59-102) 110/78 (12/21 0947) SpO2:  [95 %-100 %] 99 % (12/21 0947)  Intake/Output from previous day: 12/20 0701 - 12/21 0700 In: 1111.3 [I.V.:811.3; IV Piggyback:300] Out: 1950 [Urine:1650; Blood:300] Intake/Output this shift: Total I/O In: 592.9 [P.O.:120; I.V.:472.9] Out: 50 [Urine:50]  Recent Labs    09/26/23 1431 09/27/23 0347  HGB 12.8 11.4*   Recent Labs    09/26/23 1431 09/27/23 0347  WBC 14.1* 15.2*  RBC 4.55 4.04  HCT 39.2 35.0*  PLT 251 246   Recent Labs    09/26/23 1431 09/27/23 0347  NA 139 136  K 4.0 3.9  CL 104 103  CO2 26 24  BUN 16 16  CREATININE 0.55 0.56  GLUCOSE 116* 127*  CALCIUM 8.6* 8.0*   No results for input(s): "LABPT", "INR" in the last 72 hours.  Sensation intact distally Intact pulses distally Dorsiflexion/Plantar flexion intact Incision: dressing C/D/I   Assessment/Plan: 1 Day Post-Op Procedure(s) (LRB): LEFT TOTAL HIP ARTHROPLASTY ANTERIOR APPROACH (Left) Up with therapy Discharge home with home health this afternoon if feeling better and clears PT.      Kathryne Hitch 09/27/2023, 11:15 AM

## 2023-09-27 NOTE — Discharge Instructions (Signed)

## 2023-09-27 NOTE — TOC Progression Note (Signed)
Transition of Care [facility]) - Progression Note    Patient Details  Name: Marie Davis MRN: [id number] Date of Birth: 1971-08-02  [facility] of [facility]) CM/SW Contact  Georgie Chard, LCSW Phone Number: 09/27/2023, 3:10 PM  Clinical Narrative:    CSW ordered walker from [employer]. There are no further TOC needs.        Expected Discharge Plan and Services         Expected Discharge Date: 09/27/23                                     Social Determinants of Health (SDOH) Interventions SDOH Screenings   Food Insecurity: No Food Insecurity (09/26/2023)  Housing: Unknown (09/26/2023)  Transportation Needs: No Transportation Needs (09/26/2023)  Utilities: Not At Risk (09/26/2023)  Financial Resource Strain: Low Risk  (06/26/2023)   Received from [facility]  Physical Activity: Unknown (06/26/2023)   Received from [facility]  Social Connections: Socially Integrated (06/26/2023)   Received from [facility]  Stress: No Stress Concern Present (06/26/2023)   Received from [facility]  Tobacco Use: Low Risk  (09/26/2023)    Readmission Risk Interventions     No data to display

## 2023-09-27 NOTE — Plan of Care (Signed)
  Problem: Education: Goal: Knowledge of General Education information will improve Description: Including pain rating scale, medication(s)/side effects and non-pharmacologic comfort measures 09/27/2023 1057 by Joanne Chars, RN Outcome: Progressing 09/27/2023 1057 by Joanne Chars, RN Outcome: Progressing   Problem: Health Behavior/Discharge Planning: Goal: Ability to manage health-related needs will improve 09/27/2023 1057 by Joanne Chars, RN Outcome: Progressing 09/27/2023 1057 by Joanne Chars, RN Outcome: Progressing   Problem: Clinical Measurements: Goal: Ability to maintain clinical measurements within normal limits will improve 09/27/2023 1057 by Joanne Chars, RN Outcome: Progressing 09/27/2023 1057 by Joanne Chars, RN Outcome: Progressing Goal: Will remain free from infection 09/27/2023 1057 by Joanne Chars, RN Outcome: Progressing 09/27/2023 1057 by Joanne Chars, RN Outcome: Progressing Goal: Diagnostic test results will improve 09/27/2023 1057 by Joanne Chars, RN Outcome: Progressing 09/27/2023 1057 by Joanne Chars, RN Outcome: Progressing Goal: Respiratory complications will improve 09/27/2023 1057 by Joanne Chars, RN Outcome: Progressing 09/27/2023 1057 by Joanne Chars, RN Outcome: Progressing Goal: Cardiovascular complication will be avoided 09/27/2023 1057 by Joanne Chars, RN Outcome: Progressing 09/27/2023 1057 by Joanne Chars, RN Outcome: Progressing   Problem: Activity: Goal: Risk for activity intolerance will decrease 09/27/2023 1057 by Joanne Chars, RN Outcome: Progressing 09/27/2023 1057 by Joanne Chars, RN Outcome: Progressing   Problem: Nutrition: Goal: Adequate nutrition will be maintained 09/27/2023 1057 by Joanne Chars, RN Outcome: Progressing 09/27/2023 1057 by Joanne Chars, RN Outcome: Progressing   Problem: Coping: Goal: Level of anxiety will decrease 09/27/2023 1057 by Joanne Chars, RN Outcome:  Progressing 09/27/2023 1057 by Joanne Chars, RN Outcome: Progressing   Problem: Elimination: Goal: Will not experience complications related to bowel motility Outcome: Progressing Goal: Will not experience complications related to urinary retention Outcome: Progressing   Problem: Pain Management: Goal: General experience of comfort will improve Outcome: Progressing   Problem: Safety: Goal: Ability to remain free from injury will improve Outcome: Progressing   Problem: Skin Integrity: Goal: Risk for impaired skin integrity will decrease Outcome: Progressing   Problem: Education: Goal: Knowledge of the prescribed therapeutic regimen will improve Outcome: Progressing Goal: Understanding of discharge needs will improve Outcome: Progressing Goal: Individualized Educational Video(s) Outcome: Progressing   Problem: Activity: Goal: Ability to avoid complications of mobility impairment will improve Outcome: Progressing Goal: Ability to tolerate increased activity will improve Outcome: Progressing   Problem: Clinical Measurements: Goal: Postoperative complications will be avoided or minimized Outcome: Progressing   Problem: Pain Management: Goal: Pain level will decrease with appropriate interventions Outcome: Progressing   Problem: Skin Integrity: Goal: Will show signs of wound healing Outcome: Progressing

## 2023-09-29 ENCOUNTER — Encounter (HOSPITAL_COMMUNITY): Payer: Self-pay | Admitting: Orthopaedic Surgery

## 2023-09-29 ENCOUNTER — Encounter: Payer: Self-pay | Admitting: Orthopaedic Surgery

## 2023-10-09 ENCOUNTER — Ambulatory Visit: Payer: 59 | Admitting: Orthopaedic Surgery

## 2023-10-09 ENCOUNTER — Encounter: Payer: Self-pay | Admitting: Orthopaedic Surgery

## 2023-10-09 DIAGNOSIS — Z96642 Presence of left artificial hip joint: Secondary | ICD-10-CM

## 2023-10-09 MED ORDER — FLUCONAZOLE 150 MG PO TABS
150.0000 mg | ORAL_TABLET | Freq: Every day | ORAL | 0 refills | Status: AC
Start: 2023-10-09 — End: 2023-10-12

## 2023-10-09 MED ORDER — METHOCARBAMOL 500 MG PO TABS: 500.0000 mg | ORAL_TABLET | Freq: Four times a day (QID) | ORAL | 1 refills | Status: AC | PRN

## 2023-10-09 NOTE — Progress Notes (Signed)
 The patient comes in today for her first postoperative visit status post a left total hip replacement.  She is doing well overall.  She does have a yeast infection and would like us  to send in some Diflucan .  She only needs some Robaxin  to take at night.  Overall she is ambulating with a cane and doing very well.  Her left hip incision thus far looks good.  The staples been removed and Steri-Strips applied.  There is no significant seroma.  Her leg lengths are equal.  Her calf is soft.  She can stop her baby aspirin  twice daily.  She will continue to slowly increase her activities as comfort allows.  I would like to see her back in a month to see how she is doing overall but no x-rays are needed.  If she does develop any wound issues at all she knows to immediately let us  know.

## 2023-10-10 ENCOUNTER — Encounter: Payer: Self-pay | Admitting: Orthopaedic Surgery

## 2023-10-10 ENCOUNTER — Encounter: Payer: Self-pay | Admitting: Radiology

## 2023-10-14 ENCOUNTER — Encounter: Payer: Self-pay | Admitting: Orthopaedic Surgery

## 2023-10-15 ENCOUNTER — Other Ambulatory Visit: Payer: Self-pay | Admitting: Orthopaedic Surgery

## 2023-10-15 MED ORDER — FLUCONAZOLE 150 MG PO TABS: 150.0000 mg | ORAL_TABLET | Freq: Every day | ORAL | 0 refills | Status: AC

## 2023-11-04 ENCOUNTER — Other Ambulatory Visit: Payer: Self-pay | Admitting: Orthopaedic Surgery

## 2023-11-12 ENCOUNTER — Ambulatory Visit (INDEPENDENT_AMBULATORY_CARE_PROVIDER_SITE_OTHER): Payer: 59 | Admitting: Orthopaedic Surgery

## 2023-11-12 ENCOUNTER — Encounter: Payer: Self-pay | Admitting: Orthopaedic Surgery

## 2023-11-12 DIAGNOSIS — Z96642 Presence of left artificial hip joint: Secondary | ICD-10-CM

## 2023-11-12 NOTE — Progress Notes (Signed)
 The patient is now around 6 weeks status post a left total hip replacement.  She has some days where it is sore and other days where she is doing well.  Sometimes she feels like she is pushing too far but overall she is doing great compared to what her hip felt like before surgery.  Her right hip still remains asymptomatic and has no issues.  She is walking without assistive device.  She is 53 years old.  When we looked at her hip incision there is a small area at the top is still in the heel at the groin crease but overall though it looks good.  I see no evidence of infection.  She tolerates me easily putting her left operative hip through internal and external rotation.  Her right hip also moves smoothly.  Her gait is much improved as well.  At this point we do not need to see her back for 6 months unless she has issues.  Will have a standing AP pelvis and a lateral of her left operative hip at that visit.

## 2024-05-12 ENCOUNTER — Encounter: Payer: Self-pay | Admitting: Orthopaedic Surgery

## 2024-05-12 ENCOUNTER — Ambulatory Visit (INDEPENDENT_AMBULATORY_CARE_PROVIDER_SITE_OTHER): Payer: 59 | Admitting: Orthopaedic Surgery

## 2024-05-12 ENCOUNTER — Other Ambulatory Visit (INDEPENDENT_AMBULATORY_CARE_PROVIDER_SITE_OTHER): Payer: Self-pay

## 2024-05-12 DIAGNOSIS — Z96642 Presence of left artificial hip joint: Secondary | ICD-10-CM | POA: Diagnosis not present

## 2024-05-12 NOTE — Progress Notes (Signed)
 The patient is an active 53 year old female who is now 8 months status post a left total hip replacement secondary to severe left hip arthritis.  This was seen on MRI and we did review the MRI again today from last year that showed severe arthritis of her left hip that did not show up on plain films.  Her right hip has never been symptomatic and even the MRI which showed enough of the right hip did not show any significant arthritic issues with that hip other than just some small cystic changes in the acetabulum but this was very small.  She says both hips are doing very well and she reports good range of motion and strength.  She did have some pain after a lot of walking on a mission trip to [place] this summer but overall she is doing great.    She walks with a normal-appearing gait.  Her right and left hips move smoothly and fluidly with no blocks to rotation and no pain or guarding.  Standing AP pelvis and lateral of the left hip shows a well-seated total hip arthroplasty with no complicating features.  The AP view also shows a well-maintained right hip joint space.  At this point follow-up can be as needed.  If there are any issues with her operative hip or other orthopedic issues she knows to reach out and let us  know.

## 2024-07-14 ENCOUNTER — Encounter (INDEPENDENT_AMBULATORY_CARE_PROVIDER_SITE_OTHER): Payer: Self-pay | Admitting: *Deleted

## 2024-08-09 ENCOUNTER — Encounter: Payer: Self-pay | Admitting: Radiology
